# Patient Record
Sex: Male | Born: 1955 | Race: Black or African American | Hispanic: No | Smoking: Former smoker
Health system: Southern US, Community
[De-identification: ages and names within clinical notes are randomized; demographics above are authoritative.]

## PROBLEM LIST (undated history)

## (undated) DIAGNOSIS — I499 Cardiac arrhythmia, unspecified: Secondary | ICD-10-CM

## (undated) DIAGNOSIS — G4730 Sleep apnea, unspecified: Secondary | ICD-10-CM

## (undated) DIAGNOSIS — I10 Essential (primary) hypertension: Secondary | ICD-10-CM

## (undated) DIAGNOSIS — R0602 Shortness of breath: Secondary | ICD-10-CM

## (undated) DIAGNOSIS — E119 Type 2 diabetes mellitus without complications: Secondary | ICD-10-CM

## (undated) DIAGNOSIS — M1990 Unspecified osteoarthritis, unspecified site: Secondary | ICD-10-CM

## (undated) HISTORY — PX: OTHER SURGICAL HISTORY: SHX169

---

## 2003-06-29 ENCOUNTER — Inpatient Hospital Stay (HOSPITAL_COMMUNITY): Admission: EM | Admit: 2003-06-29 | Discharge: 2003-07-01 | Payer: Self-pay | Admitting: Emergency Medicine

## 2007-07-27 DIAGNOSIS — D126 Benign neoplasm of colon, unspecified: Secondary | ICD-10-CM | POA: Insufficient documentation

## 2012-11-05 ENCOUNTER — Emergency Department (HOSPITAL_COMMUNITY): Payer: Medicare FFS

## 2012-11-05 ENCOUNTER — Encounter (HOSPITAL_COMMUNITY): Payer: Self-pay | Admitting: Emergency Medicine

## 2012-11-05 ENCOUNTER — Emergency Department (HOSPITAL_COMMUNITY)
Admission: EM | Admit: 2012-11-05 | Discharge: 2012-11-05 | Disposition: A | Discharge status: Home Health | Payer: Medicare FFS | Attending: Emergency Medicine | Admitting: Emergency Medicine

## 2012-11-05 DIAGNOSIS — J40 Bronchitis, not specified as acute or chronic: Secondary | ICD-10-CM | POA: Insufficient documentation

## 2012-11-05 DIAGNOSIS — R0789 Other chest pain: Secondary | ICD-10-CM

## 2012-11-05 DIAGNOSIS — Z8739 Personal history of other diseases of the musculoskeletal system and connective tissue: Secondary | ICD-10-CM | POA: Insufficient documentation

## 2012-11-05 DIAGNOSIS — I499 Cardiac arrhythmia, unspecified: Secondary | ICD-10-CM | POA: Insufficient documentation

## 2012-11-05 DIAGNOSIS — Z87891 Personal history of nicotine dependence: Secondary | ICD-10-CM | POA: Insufficient documentation

## 2012-11-05 DIAGNOSIS — Z8679 Personal history of other diseases of the circulatory system: Secondary | ICD-10-CM | POA: Insufficient documentation

## 2012-11-05 HISTORY — DX: Unspecified osteoarthritis, unspecified site: M19.90

## 2012-11-05 HISTORY — DX: Cardiac arrhythmia, unspecified: I49.9

## 2012-11-05 LAB — BASIC METABOLIC PANEL
BUN: 10 mg/dL (ref 6–23)
CO2: 25 mEq/L (ref 19–32)
Calcium: 9.3 mg/dL (ref 8.4–10.5)
Chloride: 106 mEq/L (ref 96–112)
Creatinine, Ser: 0.91 mg/dL (ref 0.50–1.35)
GFR calc Af Amer: >90 mL/min (ref >90)
GFR calc non Af Amer: >90 mL/min (ref >90)
Glucose, Bld: 130 mg/dL — ABNORMAL HIGH (ref 70–99)
Potassium: 4.2 mEq/L (ref 3.5–5.1)
Sodium: 142 mEq/L (ref 135–145)

## 2012-11-05 LAB — CBC
HCT: 40.4 % (ref 39.0–52.0)
Hemoglobin: 14.4 g/dL (ref 13.0–17.0)
MCH: 29.4 pg (ref 26.0–34.0)
MCHC: 35.6 g/dL (ref 30.0–36.0)
MCV: 82.6 fL (ref 78.0–100.0)
Platelets: 205 10*3/uL (ref 150–400)
RBC: 4.89 MIL/uL (ref 4.22–5.81)
RDW: 13.9 % (ref 11.5–15.5)
WBC: 8.2 10*3/uL (ref 4.0–10.5)

## 2012-11-05 LAB — POCT I-STAT TROPONIN I: Troponin i, poc: 0 ng/mL (ref 0.00–0.08)

## 2012-11-05 LAB — PRO B NATRIURETIC PEPTIDE: Pro B Natriuretic peptide (BNP): 10.1 pg/mL (ref 0–125)

## 2012-11-05 MED ORDER — AZITHROMYCIN 250 MG PO TABS: ORAL_TABLET | ORAL | Status: DC

## 2012-11-05 MED ORDER — ALBUTEROL SULFATE HFA 108 (90 BASE) MCG/ACT IN AERS: 2.0000 | INHALATION_SPRAY | Freq: Four times a day (QID) | RESPIRATORY_TRACT | Status: AC | PRN

## 2012-11-05 NOTE — ED Notes (Signed)
Pt discharged to home with family. NAD.  

## 2012-11-05 NOTE — ED Provider Notes (Signed)
History     CSN: [id number]  Arrival date & time 11/05/12  1000   First MD Initiated Contact with Patient 11/05/12 1021      Chief Complaint  Patient presents with  . Chest Pain    (Consider location/radiation/quality/duration/timing/severity/associated sxs/prior treatment) HPI Comments: Patient comes to the ER for evaluation of chest pain. Patient reports that he has been experiencing intermittent left-sided chest pain for 2 days. He reports that symptoms began around the time he started to try to quit smoking. He has had a lot of increased coughing. He has noticed that the pain occurs after he coughs. It spontaneously resolves. No pain with exertion. He has felt short of breath with his coughing. No fever.  Patient does report that he has a previous history of ablation for atrial fibrillation. He thinks that he had his arteries checked at that time and they were okay.  Patient is a 57 y.o. male presenting with chest pain.  Chest Pain Associated symptoms: cough and shortness of breath     Past Medical History  Diagnosis Date  . Irregular heart beat   . Arthritis     History reviewed. No pertinent past surgical history.  History reviewed. No pertinent family history.  History  Substance Use Topics  . Smoking status: Former Games developer  . Smokeless tobacco: Not on file  . Alcohol Use: No      Review of Systems  Respiratory: Positive for cough and shortness of breath.   Cardiovascular: Positive for chest pain.  All other systems reviewed and are negative.    Allergies  Review of patient's allergies indicates no known allergies.  Home Medications  No current outpatient prescriptions on file.  BP 149/94  Pulse 70  Temp(Src) 98.2 F (36.8 C) (Oral)  Resp 18  SpO2 96%  Physical Exam  Constitutional: He is oriented to person, place, and time. He appears well-developed and well-nourished. No distress.  HENT:  Head: Normocephalic and atraumatic.  Right Ear:  Hearing normal.  Nose: Nose normal.  Mouth/Throat: Oropharynx is clear and moist and mucous membranes are normal.  Eyes: Conjunctivae and EOM are normal. Pupils are equal, round, and reactive to light.  Neck: Normal range of motion. Neck supple.  Cardiovascular: Normal rate, regular rhythm, S1 normal and S2 normal.  Exam reveals no gallop and no friction rub.   No murmur heard. Pulmonary/Chest: Effort normal and breath sounds normal. No respiratory distress. He exhibits no tenderness.  Abdominal: Soft. Normal appearance and bowel sounds are normal. There is no hepatosplenomegaly. There is no tenderness. There is no rebound, no guarding, no tenderness at McBurney's point and negative Murphy's sign. No hernia.  Musculoskeletal: Normal range of motion.  Neurological: He is alert and oriented to person, place, and time. He has normal strength. No cranial nerve deficit or sensory deficit. Coordination normal. GCS eye subscore is 4. GCS verbal subscore is 5. GCS motor subscore is 6.  Skin: Skin is warm, dry and intact. No rash noted. No cyanosis.  Psychiatric: He has a normal mood and affect. His speech is normal and behavior is normal. Thought content normal.    ED Course  Procedures (including critical care time)  EKG  Date: 11/05/2012  Rhythm: normal sinus rhythm  QRS Axis: normal  Intervals: normal  ST/T Wave abnormalities: nonspecific ST/T changes  Conduction Disutrbances:none  Narrative Interpretation:   Old EKG Reviewed: none available     Labs Reviewed  BASIC METABOLIC PANEL - Abnormal; Notable for the following:  Glucose, Bld 130 (*)    All other components within normal limits  CBC  PRO B NATRIURETIC PEPTIDE  POCT I-STAT TROPONIN I   Dg Chest 2 View  11/05/2012  *RADIOLOGY REPORT*  Clinical Data: Left chest pain, shortness of breath  CHEST - 2 VIEW  Comparison: 12/27/2006  Findings: Lungs are essentially clear.  No focal consolidation. No pleural effusion or  pneumothorax.  The heart is top normal in size.  Mild degenerative changes of the visualized thoracolumbar spine.  Old right posterior 9th rib fracture.  IMPRESSION: No evidence of acute cardiopulmonary disease.   Original Report Authenticated By: Charline Bills, M.D.      Diagnoses: 1. Bronchitis 2. Atypical chest pain    MDM  Patient comes to the ER for evaluation of chest pain. Symptoms are very atypical for cardiac chest pain. Has been experiencing increased cough secondary to trying to stop smoking. He reports that after he has a long coughing spell he gets some pain in the left side of his chest which lasts only briefly. He says it stays there only a couple of minutes and then goes away. He has had shortness of breath associated with the cough and chest congestion symptoms. Patient reports a previous ablation for atrial fibrillation at the [facility]. Patient thinks that he had a heart catheterization at the time of the ablation that did not show any coronary artery disease. Based on the very atypical nature of his symptoms, I believe the patient can be discharged. His cardiac workup was negative here. He is to followup with his doctor at the [place]. Patient was counseled to return to the ER for any pain does not last only briefly like his current symptoms have been. Will be prescribed albuterol and Zithromax for the cough and chest congestion.        Gilda Crease, MD 11/05/12 1250

## 2012-11-05 NOTE — ED Notes (Signed)
Pt c/o left sided CP x 2 days with SOB with exertion

## 2015-01-17 ENCOUNTER — Emergency Department (HOSPITAL_COMMUNITY)
Admission: EM | Admit: 2015-01-17 | Discharge: 2015-01-17 | Disposition: A | Discharge status: Home / Self Care | Payer: Medicare PPO | Attending: Emergency Medicine | Admitting: Emergency Medicine

## 2015-01-17 ENCOUNTER — Encounter (HOSPITAL_COMMUNITY): Payer: Self-pay | Admitting: *Deleted

## 2015-01-17 DIAGNOSIS — M1990 Unspecified osteoarthritis, unspecified site: Secondary | ICD-10-CM | POA: Insufficient documentation

## 2015-01-17 DIAGNOSIS — Z8679 Personal history of other diseases of the circulatory system: Secondary | ICD-10-CM | POA: Insufficient documentation

## 2015-01-17 DIAGNOSIS — Z79899 Other long term (current) drug therapy: Secondary | ICD-10-CM | POA: Diagnosis not present

## 2015-01-17 DIAGNOSIS — R030 Elevated blood-pressure reading, without diagnosis of hypertension: Secondary | ICD-10-CM | POA: Diagnosis present

## 2015-01-17 DIAGNOSIS — IMO0001 Reserved for inherently not codable concepts without codable children: Secondary | ICD-10-CM

## 2015-01-17 DIAGNOSIS — Z720 Tobacco use: Secondary | ICD-10-CM | POA: Insufficient documentation

## 2015-01-17 DIAGNOSIS — Z7982 Long term (current) use of aspirin: Secondary | ICD-10-CM | POA: Diagnosis not present

## 2015-01-17 NOTE — ED Provider Notes (Signed)
CSN: [id number]     Arrival date & time 01/17/15  1731 History  This chart was scribed for Steven Bible, PA-C, working with Ernestina Patches, MD by Steva Colder, ED Scribe. The patient was seen in room TR03C/TR03C at 8:37 PM.    Chief Complaint  Patient presents with  . Hypertension      The history is provided by the patient. No language interpreter was used.    HPI Comments: [name] is a 59 y.o. male who presents to the Emergency Department complaining of HTN onset last night. Pt notes that he was feeling dizzy last evening, which has resolved.   He also states that  his BP was 178/88 this evening at the EMS bay and was informed to get it checked. Pt reports that he has been under a lot of stress lately and that the dizziness occurred yesterday when he bends over or moved. Pt recently went to the [place] 1 month ago and had labs checked and he reports everything returned normal. He denies dizziness, CP, HA, vision changes, weakness, urinary issues, and any other symptoms. Pt denies hx of HTN.  Past Medical History  Diagnosis Date  . Irregular heart beat   . Arthritis    History reviewed. No pertinent past surgical history. No family history on file. History  Substance Use Topics  . Smoking status: Current Every Day Smoker -- 0.50 packs/day    Types: Cigarettes  . Smokeless tobacco: Not on file  . Alcohol Use: No    Review of Systems  Eyes: Negative for visual disturbance.  Cardiovascular: Negative for chest pain.  Genitourinary: Negative for difficulty urinating.  Neurological: Negative for dizziness, weakness and headaches.      Allergies  Review of patient's allergies indicates no known allergies.  Home Medications   Prior to Admission medications   Medication Sig Start Date End Date Taking? Authorizing Provider  albuterol (PROVENTIL HFA;VENTOLIN HFA) 108 (90 BASE) MCG/ACT inhaler Inhale 2 puffs into the lungs every 6 (six) hours as needed for wheezing. 11/05/12   Yes Orpah Greek, MD  aspirin 325 MG EC tablet Take 325 mg by mouth daily.   Yes Historical Provider, MD  traMADol (ULTRAM) 50 MG tablet Take 50 mg by mouth every 6 (six) hours as needed for moderate pain.   Yes Historical Provider, MD   BP 164/96 mmHg  Pulse 72  Temp(Src) 98.3 F (36.8 C) (Oral)  Resp 19  SpO2 99% Physical Exam  Constitutional: He is oriented to person, place, and time. He appears well-developed and well-nourished. No distress.  HENT:  Head: Normocephalic and atraumatic.  Eyes: EOM are normal. Pupils are equal, round, and reactive to light.  Neck: Neck supple. No tracheal deviation present.  Cardiovascular: Normal rate, regular rhythm and normal heart sounds.  Exam reveals no gallop and no friction rub.   No murmur heard. Pulmonary/Chest: Effort normal and breath sounds normal. No respiratory distress. He has no wheezes. He has no rales.  Musculoskeletal: Normal range of motion.  Neurological: He is alert and oriented to person, place, and time. No cranial nerve deficit. Gait normal.  Cranial nerves intact. Nl gait, no ataxia. Nl rapid alternating movements. Nl finger to nose movements.  Skin: Skin is warm and dry.  Psychiatric: He has a normal mood and affect. His behavior is normal.  Nursing note and vitals reviewed.   ED Course  Procedures (including critical care time) DIAGNOSTIC STUDIES: Oxygen Saturation is 96% on RA, nl by my  interpretation.    COORDINATION OF CARE: 8:43 PM-Discussed treatment plan with pt at bedside and pt agreed to plan.   Labs Review Labs Reviewed - No data to display  Imaging Review No results found.   EKG Interpretation None     Today's Vitals   01/17/15 1750 01/17/15 1751 01/17/15 2035  BP: 138/85  164/96  Pulse: 76  72  Temp: 98.3 F (36.8 C)    TempSrc: Oral    Resp: 18  19  SpO2: 96%  99%  PainSc:  0-No pain    MDM   Final diagnoses:  None   Patient presents today with a chief complaint of HTN.   He is asymptomatic at this time.  No signs of Hypertensive Emergency or Urgency.  Normal neurological exam.  BP 138/85 in the ED without medications.  Feel that the patient is stable for discharge.  Patient instructed to follow up with PCP regarding elevated blood pressure.     Steven Bible, PA-C 01/19/15 2226  Ernestina Patches, MD 01/21/15 [phone]

## 2015-01-17 NOTE — ED Notes (Signed)
Pt c/o dizziness when bending over last night, went to ambulance bay and his BP was 178/88 and was instructed to come to ED. Pt states that he has been under a lot of stress lately. States that last night is when he noticed the dizziness when he bends over. Denies any dizziness at this time. States that he had a full work up last month at the [place] in [place] and everything checked out fine.

## 2015-01-17 NOTE — Discharge Instructions (Signed)
It is recommended that you keep a log of your blood pressure.  DASH Eating Plan DASH stands for "Dietary Approaches to Stop Hypertension." The DASH eating plan is a healthy eating plan that has been shown to reduce high blood pressure (hypertension). Additional health benefits may include reducing the risk of type 2 diabetes mellitus, heart disease, and stroke. The DASH eating plan may also help with weight loss. WHAT DO I NEED TO KNOW ABOUT THE DASH EATING PLAN? For the DASH eating plan, you will follow these general guidelines:  Choose foods with a percent daily value for sodium of less than 5% (as listed on the food label).  Use salt-free seasonings or herbs instead of table salt or sea salt.  Check with your health care provider or pharmacist before using salt substitutes.  Eat lower-sodium products, often labeled as "lower sodium" or "no salt added."  Eat fresh foods.  Eat more vegetables, fruits, and low-fat dairy products.  Choose whole grains. Look for the word "whole" as the first word in the ingredient list.  Choose fish and skinless chicken or [place] more often than red meat. Limit fish, poultry, and meat to 6 oz (170 g) each day.  Limit sweets, desserts, sugars, and sugary drinks.  Choose heart-healthy fats.  Limit cheese to 1 oz (28 g) per day.  Eat more home-cooked food and less restaurant, buffet, and fast food.  Limit fried foods.  Cook foods using methods other than frying.  Limit canned vegetables. If you do use them, rinse them well to decrease the sodium.  When eating at a restaurant, ask that your food be prepared with less salt, or no salt if possible. WHAT FOODS CAN I EAT? Seek help from a dietitian for individual calorie needs. Grains Whole grain or whole wheat bread. Brown rice. Whole grain or whole wheat pasta. Quinoa, bulgur, and whole grain cereals. Low-sodium cereals. Corn or whole wheat flour tortillas. Whole grain cornbread. Whole grain  crackers. Low-sodium crackers. Vegetables Fresh or frozen vegetables (raw, steamed, roasted, or grilled). Low-sodium or reduced-sodium tomato and vegetable juices. Low-sodium or reduced-sodium tomato sauce and paste. Low-sodium or reduced-sodium canned vegetables.  Fruits All fresh, canned (in natural juice), or frozen fruits. Meat and Other Protein Products Ground beef (85% or leaner), grass-fed beef, or beef trimmed of fat. Skinless chicken or [place]. Ground chicken or [place]. Pork trimmed of fat. All fish and seafood. Eggs. Dried beans, peas, or lentils. Unsalted nuts and seeds. Unsalted canned beans. Dairy Low-fat dairy products, such as skim or 1% milk, 2% or reduced-fat cheeses, low-fat ricotta or cottage cheese, or plain low-fat yogurt. Low-sodium or reduced-sodium cheeses. Fats and Oils Tub margarines without trans fats. Light or reduced-fat mayonnaise and salad dressings (reduced sodium). Avocado. Safflower, olive, or canola oils. Natural peanut or almond butter. Other Unsalted popcorn and pretzels. The items listed above may not be a complete list of recommended foods or beverages. Contact your dietitian for more options. WHAT FOODS ARE NOT RECOMMENDED? Grains White bread. White pasta. White rice. Refined cornbread. Bagels and croissants. Crackers that contain trans fat. Vegetables Creamed or fried vegetables. Vegetables in a cheese sauce. Regular canned vegetables. Regular canned tomato sauce and paste. Regular tomato and vegetable juices. Fruits Dried fruits. Canned fruit in light or heavy syrup. Fruit juice. Meat and Other Protein Products Fatty cuts of meat. Ribs, chicken wings, bacon, sausage, bologna, salami, chitterlings, fatback, hot dogs, bratwurst, and packaged luncheon meats. Salted nuts and seeds. Canned beans with salt. Dairy Whole  or 2% milk, cream, half-and-half, and cream cheese. Whole-fat or sweetened yogurt. Full-fat cheeses or blue cheese. Nondairy creamers and  whipped toppings. Processed cheese, cheese spreads, or cheese curds. Condiments Onion and garlic salt, seasoned salt, table salt, and sea salt. Canned and packaged gravies. Worcestershire sauce. Tartar sauce. Barbecue sauce. Teriyaki sauce. Soy sauce, including reduced sodium. Steak sauce. Fish sauce. Oyster sauce. Cocktail sauce. Horseradish. Ketchup and mustard. Meat flavorings and tenderizers. Bouillon cubes. Hot sauce. Tabasco sauce. Marinades. Taco seasonings. Relishes. Fats and Oils Butter, stick margarine, lard, shortening, ghee, and bacon fat. Coconut, palm kernel, or palm oils. Regular salad dressings. Other Pickles and olives. Salted popcorn and pretzels. The items listed above may not be a complete list of foods and beverages to avoid. Contact your dietitian for more information. WHERE CAN I FIND MORE INFORMATION? National Heart, Lung, and Blood Institute: travelstabloid.com Document Released: 07/01/2011 Document Revised: 11/26/2013 Document Reviewed: 05/16/2013 [facility] Patient Information 2015 [place], [place]. This information is not intended to replace advice given to you by your health care provider. Make sure you discuss any questions you have with your health care provider.  Hypertension Hypertension is another name for high blood pressure. High blood pressure forces your heart to work harder to pump blood. A blood pressure reading has two numbers, which includes a higher number over a lower number (example: 110/72). HOME CARE   Have your blood pressure rechecked by your doctor.  Only take medicine as told by your doctor. Follow the directions carefully. The medicine does not work as well if you skip doses. Skipping doses also puts you at risk for problems.  Do not smoke.  Monitor your blood pressure at home as told by your doctor. GET HELP IF:  You think you are having a reaction to the medicine you are taking.  You have repeat headaches  or feel dizzy.  You have puffiness (swelling) in your ankles.  You have trouble with your vision. GET HELP RIGHT AWAY IF:   You get a very bad headache and are confused.  You feel weak, numb, or faint.  You get chest or belly (abdominal) pain.  You throw up (vomit).  You cannot breathe very well. MAKE SURE YOU:   Understand these instructions.  Will watch your condition.  Will get help right away if you are not doing well or get worse. Document Released: 12/29/2007 Document Revised: 07/17/2013 Document Reviewed: 05/04/2013 [facility] Patient Information 2015 [place], [place]. This information is not intended to replace advice given to you by your health care provider. Make sure you discuss any questions you have with your health care provider.

## 2017-02-25 ENCOUNTER — Emergency Department (HOSPITAL_COMMUNITY)
Admission: EM | Admit: 2017-02-25 | Discharge: 2017-02-25 | Disposition: A | Discharge status: Home / Self Care | Payer: Non-veteran care | Attending: Emergency Medicine | Admitting: Emergency Medicine

## 2017-02-25 ENCOUNTER — Encounter (HOSPITAL_COMMUNITY): Payer: Self-pay | Admitting: Emergency Medicine

## 2017-02-25 ENCOUNTER — Emergency Department (HOSPITAL_COMMUNITY): Payer: Non-veteran care

## 2017-02-25 DIAGNOSIS — I10 Essential (primary) hypertension: Secondary | ICD-10-CM | POA: Insufficient documentation

## 2017-02-25 DIAGNOSIS — Z79899 Other long term (current) drug therapy: Secondary | ICD-10-CM | POA: Insufficient documentation

## 2017-02-25 DIAGNOSIS — F17210 Nicotine dependence, cigarettes, uncomplicated: Secondary | ICD-10-CM | POA: Diagnosis not present

## 2017-02-25 DIAGNOSIS — Z7982 Long term (current) use of aspirin: Secondary | ICD-10-CM | POA: Insufficient documentation

## 2017-02-25 DIAGNOSIS — R42 Dizziness and giddiness: Secondary | ICD-10-CM | POA: Insufficient documentation

## 2017-02-25 LAB — I-STAT TROPONIN, ED: Troponin i, poc: 0 ng/mL (ref 0.00–0.08)

## 2017-02-25 LAB — CBC WITH DIFFERENTIAL/PLATELET
Basophils Absolute: 0 10*3/uL (ref 0.0–0.1)
Basophils Relative: 0 %
Eosinophils Absolute: 0.1 10*3/uL (ref 0.0–0.7)
Eosinophils Relative: 0 %
HCT: 43.3 % (ref 39.0–52.0)
Hemoglobin: 14.6 g/dL (ref 13.0–17.0)
Lymphocytes Relative: 14 %
Lymphs Abs: 1.7 10*3/uL (ref 0.7–4.0)
MCH: 27.9 pg (ref 26.0–34.0)
MCHC: 33.7 g/dL (ref 30.0–36.0)
MCV: 82.8 fL (ref 78.0–100.0)
Monocytes Absolute: 0.4 10*3/uL (ref 0.1–1.0)
Monocytes Relative: 3 %
Neutro Abs: 9.8 10*3/uL — ABNORMAL HIGH (ref 1.7–7.7)
Neutrophils Relative %: 83 %
Platelets: 217 10*3/uL (ref 150–400)
RBC: 5.23 MIL/uL (ref 4.22–5.81)
RDW: 14.4 % (ref 11.5–15.5)
WBC: 11.9 10*3/uL — ABNORMAL HIGH (ref 4.0–10.5)

## 2017-02-25 LAB — BASIC METABOLIC PANEL
Anion gap: 8 (ref 5–15)
BUN: 11 mg/dL (ref 6–20)
CO2: 29 mmol/L (ref 22–32)
Calcium: 9.4 mg/dL (ref 8.9–10.3)
Chloride: 104 mmol/L (ref 101–111)
Creatinine, Ser: 1.04 mg/dL (ref 0.61–1.24)
GFR calc Af Amer: >60 mL/min (ref >60)
GFR calc non Af Amer: >60 mL/min (ref >60)
Glucose, Bld: 158 mg/dL — ABNORMAL HIGH (ref 65–99)
Potassium: 4.1 mmol/L (ref 3.5–5.1)
Sodium: 141 mmol/L (ref 135–145)

## 2017-02-25 MED ORDER — ONDANSETRON HCL 4 MG PO TABS
4.0000 mg | ORAL_TABLET | Freq: Once | ORAL | Status: AC
  Administered 2017-02-25: 4 mg via ORAL
  Filled 2017-02-25: qty 1

## 2017-02-25 MED ORDER — MECLIZINE HCL 25 MG PO TABS: 25.0000 mg | ORAL_TABLET | Freq: Three times a day (TID) | ORAL | 0 refills | Status: DC | PRN

## 2017-02-25 MED ORDER — MECLIZINE HCL 25 MG PO TABS
25.0000 mg | ORAL_TABLET | Freq: Once | ORAL | Status: AC
  Administered 2017-02-25: 25 mg via ORAL
  Filled 2017-02-25: qty 1

## 2017-02-25 MED ORDER — PROMETHAZINE HCL 12.5 MG PO TABS: 25.0000 mg | ORAL_TABLET | Freq: Four times a day (QID) | ORAL | 0 refills | Status: DC | PRN

## 2017-02-25 NOTE — ED Triage Notes (Signed)
Report from [facility]> reports sudden onset of dizziness when he sat up in bed at 6pm.  Reports nausea at the time that has resolved.  PT denies dizziness when he lays back down.   History of HTN.  Dr. Oleta Mouse and resident notified of pt with sudden onset of dizziness.

## 2017-02-25 NOTE — Discharge Instructions (Signed)
Your work up was reassuring today. Please return to ED with any worsening of symptoms, weakness, numbness or tingling, or any new concerning neurological symptoms. Please do not drink or drive while taking meclizine or Phenergan.

## 2017-02-25 NOTE — ED Notes (Signed)
Lab at bedside

## 2017-02-25 NOTE — ED Notes (Signed)
Returned from CT.

## 2017-02-25 NOTE — ED Provider Notes (Signed)
[facility] DEPT Provider Note   CSN: [id number] Arrival date & time: 02/25/17  1937     History   Chief Complaint Chief Complaint  Patient presents with  . Dizziness      HPI Steven Powers is a 61 y.o. Male. With history of hypertension, arthritis who presents with complaints of dizziness. Onset was 1.5 hours prior to arrival when patient went from sitting to standing position. Symptoms are described as room spinning, and associated with nausea. Denies any vomiting, fevers or chills, weakness, numbness or tingling. He denies any changes in vision. Symptoms improved with lying still, and are exacerbated by any movement. She has never had symptoms of this before. He denies any previous URI symptoms.  Past Medical History:  Diagnosis Date  . Arthritis   . Irregular heart beat     Patient Active Problem List   Diagnosis Date Noted  . Irregular heart beat     History reviewed. No pertinent surgical history.     Home Medications    Prior to Admission medications   Medication Sig Start Date End Date Taking? Authorizing Provider  aspirin 81 MG tablet Take 81 mg by mouth daily.    Yes [provider]  atenolol (TENORMIN) 25 MG tablet Take 25 mg by mouth daily.   Yes [provider]  albuterol (PROVENTIL HFA;VENTOLIN HFA) 108 (90 BASE) MCG/ACT inhaler Inhale 2 puffs into the lungs every 6 (six) hours as needed for wheezing. Patient not taking: Reported on 02/25/2017 11/05/12   Orpah Greek, MD  meclizine (ANTIVERT) 25 MG tablet Take 1 tablet (25 mg total) by mouth 3 (three) times daily as needed for dizziness. 02/25/17   Arnetha Massy, MD  promethazine (PHENERGAN) 12.5 MG tablet Take 2 tablets (25 mg total) by mouth every 6 (six) hours as needed for nausea or vomiting. 02/25/17   Arnetha Massy, MD    Family History No family history on file.  Social History Social History  Substance Use Topics  . Smoking status: Current Every Day Smoker   Packs/day: 0.50    Types: Cigarettes  . Smokeless tobacco: Never Used  . Alcohol use No     Allergies   Patient has no known allergies.   Review of Systems Review of Systems  Constitutional: Negative for chills and fever.  HENT: Negative for ear pain and sore throat.   Eyes: Negative for pain and visual disturbance.  Respiratory: Negative for cough and shortness of breath.   Cardiovascular: Negative for chest pain and palpitations.  Gastrointestinal: Positive for nausea. Negative for abdominal pain and vomiting.  Genitourinary: Negative for dysuria and hematuria.  Musculoskeletal: Negative for arthralgias and back pain.  Skin: Negative for color change and rash.  Neurological: Positive for dizziness. Negative for seizures and syncope.  All other systems reviewed and are negative.    Physical Exam Updated Vital Signs BP (!) 138/99   Pulse 60   Temp 97.6 F (36.4 C)   Resp 14   SpO2 95%   Physical Exam  Constitutional: He is oriented to person, place, and time. He appears well-developed and well-nourished.  HENT:  Head: Normocephalic and atraumatic.  Eyes: Conjunctivae are normal.  Neck: Neck supple.  Cardiovascular: Normal rate and regular rhythm.   No murmur heard. Pulmonary/Chest: Effort normal and breath sounds normal. No respiratory distress.  Abdominal: Soft. There is no tenderness.  Musculoskeletal: He exhibits no edema.  Neurological: He is alert and oriented to person, place, and time. No cranial nerve  deficit or sensory deficit. He exhibits normal muscle tone. Coordination normal.  No nystagmus. Symptomatic with dix-hallpike.  Skin: Skin is warm and dry.  Psychiatric: He has a normal mood and affect.  Nursing note and vitals reviewed.    ED Treatments / Results  Labs (all labs ordered are listed, but only abnormal results are displayed) Labs Reviewed  CBC WITH DIFFERENTIAL/PLATELET - Abnormal; Notable for the following:       Result Value   WBC  11.9 (*)    Neutro Abs 9.8 (*)    All other components within normal limits  BASIC METABOLIC PANEL - Abnormal; Notable for the following:    Glucose, Bld 158 (*)    All other components within normal limits  I-STAT TROPONIN, ED    EKG  EKG Interpretation  Date/Time:  Friday February 25 2017 20:04:32 EDT Ventricular Rate:  57 PR Interval:    QRS Duration: 83 QT Interval:  401 QTC Calculation: 391 R Axis:   -31 Text Interpretation:  Sinus rhythm Left atrial enlargement Left axis deviation Nonspecific T abnormalities, lateral leads Baseline wander in lead(s) I II aVR aVL V3 Confirmed by Brantley Stage [phone]) on 02/25/2017 9:41:06 PM       Radiology Dg Chest 2 View  Result Date: 02/25/2017 CLINICAL DATA:  Dizziness and high BP x 2 days, smoker, hx irreg heart rate EXAM: CHEST  2 VIEW COMPARISON:  Chest x-ray dated 11/05/2012. FINDINGS: Cardiomegaly is stable. Overall cardiomediastinal silhouette is stable. Atherosclerotic changes noted at the aortic arch. Lungs are clear. No pleural effusion or pneumothorax seen. Osseous structures about the chest are unremarkable. IMPRESSION: No active cardiopulmonary disease. No evidence of pneumonia or pulmonary edema. Stable cardiomegaly. Aortic atherosclerosis. Electronically Signed   By: Franki Cabot M.D.   On: 02/25/2017 20:50    Procedures Procedures (including critical care time)  Medications Ordered in ED Medications  meclizine (ANTIVERT) tablet 25 mg (25 mg Oral Given 02/25/17 2138)  ondansetron (ZOFRAN) tablet 4 mg (4 mg Oral Given 02/25/17 2206)     Initial Impression / Assessment and Plan / ED Course  I have reviewed the triage vital signs and the nursing notes.  Pertinent labs & imaging results that were available during my care of the patient were reviewed by me and considered in my medical decision making (see chart for details).    Patient arrived hemodynamically stable, in mild distress. Exam as above, significant for positional  symptoms elicited during dix-hallpike. Considered CVA - but nonfocal neuro exam without any cerebellar/ataxia deficits. Improvement in symptoms with antiemetics and meclizine. Exam consistent with BPPV. No indication for further imaging at this time. Labs reassuring. EKG showing T wave depression in lateral leads - but patient denies any chest pain, shortness of breath, palpitations. Last EKG to compare 2014, I suspect these changes are not acute. Discussed symptoms with patient, close follow-up with PCM. Return precautions discussed. Patient in agreement with plan at time of discharge.  Patient and plan of care discussed with Attending physician, Dr. Oleta Mouse.    Final Clinical Impressions(s) / ED Diagnoses   Final diagnoses:  Vertigo    New Prescriptions Discharge Medication List as of 02/25/2017 10:52 PM    START taking these medications   Details  meclizine (ANTIVERT) 25 MG tablet Take 1 tablet (25 mg total) by mouth 3 (three) times daily as needed for dizziness., Starting Fri 02/25/2017, Print    promethazine (PHENERGAN) 12.5 MG tablet Take 2 tablets (25 mg total) by mouth every 6 (  six) hours as needed for nausea or vomiting., Starting Fri 02/25/2017, Print         Arnetha Massy, MD 02/26/17 [id number]    Forde Dandy, MD 02/26/17 [phone]

## 2017-02-26 NOTE — ED Provider Notes (Signed)
I saw and evaluated the patient, reviewed the resident's note and I agree with the findings and plan.   EKG Interpretation  Date/Time:  Friday February 25 2017 20:04:32 EDT Ventricular Rate:  57 PR Interval:    QRS Duration: 83 QT Interval:  401 QTC Calculation: 391 R Axis:   -31 Text Interpretation:  Sinus rhythm Left atrial enlargement Left axis deviation Nonspecific T abnormalities, lateral leads Baseline wander in lead(s) I II aVR aVL V3 Confirmed by Brantley Stage [phone]) on 02/25/2017 9:41:72 PM      61 year old male with history of HTN who presents with sudden onset dizziness 1 hour PTA. States that he sat up from bed and had sudden onset of dizziness that felt like the room may be spinning. It is associated with nausea but denies any vomiting. Symptoms improved with lying down and closing his eyes. Denies any syncope, chest pain or difficulty breathing. No vision or speech changes, difficulty swallowing, focal numbness or weakness. States that he felt like he was drunk.  On presentation, minimally hypertensive. He has a normal neurological exam. No features on neurological exam that would suggest central vertigo. No abnormal test of skew, dysmetria, neuro deficits. He did receive meclizine, with improvement in his symptoms. I was able to ambulate him and he states that he feels significantly better. We'll discharge with meclizine. Strict return and follow-up instructions reviewed. He expressed understanding of all discharge instructions and felt comfortable with the plan of care.     Forde Dandy, MD 02/26/17 [phone]

## 2017-07-26 DIAGNOSIS — E119 Type 2 diabetes mellitus without complications: Secondary | ICD-10-CM | POA: Insufficient documentation

## 2017-12-20 DIAGNOSIS — M5416 Radiculopathy, lumbar region: Secondary | ICD-10-CM | POA: Insufficient documentation

## 2017-12-20 DIAGNOSIS — I499 Cardiac arrhythmia, unspecified: Secondary | ICD-10-CM | POA: Insufficient documentation

## 2017-12-20 DIAGNOSIS — M47816 Spondylosis without myelopathy or radiculopathy, lumbar region: Secondary | ICD-10-CM | POA: Insufficient documentation

## 2017-12-20 DIAGNOSIS — M51369 Other intervertebral disc degeneration, lumbar region without mention of lumbar back pain or lower extremity pain: Secondary | ICD-10-CM | POA: Insufficient documentation

## 2017-12-20 DIAGNOSIS — M5136 Other intervertebral disc degeneration, lumbar region: Secondary | ICD-10-CM | POA: Insufficient documentation

## 2018-07-06 DIAGNOSIS — R3915 Urgency of urination: Secondary | ICD-10-CM | POA: Insufficient documentation

## 2018-07-06 DIAGNOSIS — C61 Malignant neoplasm of prostate: Secondary | ICD-10-CM | POA: Insufficient documentation

## 2019-02-27 ENCOUNTER — Encounter (HOSPITAL_COMMUNITY): Payer: Self-pay | Admitting: *Deleted

## 2019-02-27 NOTE — Progress Notes (Signed)
Received [facility] authorization [id number] for this pt to participate in Pulmonary Rehab by Dr. Leanne Lovely at the [facility].  Pt diagnosis is Dypnea.  Reviewed provided medical records.  Pt is appropriate for scheduling Pulmonary Rehab.  Will forward to support staff for scheduling.

## 2019-03-02 ENCOUNTER — Telehealth (HOSPITAL_COMMUNITY): Payer: Self-pay

## 2019-05-10 ENCOUNTER — Emergency Department (HOSPITAL_COMMUNITY): Payer: No Typology Code available for payment source

## 2019-05-10 ENCOUNTER — Other Ambulatory Visit: Payer: Self-pay

## 2019-05-10 ENCOUNTER — Emergency Department (HOSPITAL_COMMUNITY)
Admission: EM | Admit: 2019-05-10 | Discharge: 2019-05-11 | Disposition: A | Discharge status: Home / Self Care | Payer: No Typology Code available for payment source | Attending: Emergency Medicine | Admitting: Emergency Medicine

## 2019-05-10 DIAGNOSIS — R079 Chest pain, unspecified: Secondary | ICD-10-CM | POA: Diagnosis present

## 2019-05-10 DIAGNOSIS — Z7982 Long term (current) use of aspirin: Secondary | ICD-10-CM | POA: Diagnosis not present

## 2019-05-10 DIAGNOSIS — F17210 Nicotine dependence, cigarettes, uncomplicated: Secondary | ICD-10-CM | POA: Insufficient documentation

## 2019-05-10 DIAGNOSIS — Z79899 Other long term (current) drug therapy: Secondary | ICD-10-CM | POA: Insufficient documentation

## 2019-05-10 MED ORDER — FENTANYL CITRATE (PF) 100 MCG/2ML IJ SOLN
50.0000 ug | Freq: Once | INTRAMUSCULAR | Status: AC
  Administered 2019-05-11: 50 ug via INTRAVENOUS
  Filled 2019-05-10: qty 2

## 2019-05-10 MED ORDER — SODIUM CHLORIDE 0.9 % IV SOLN
1000.0000 mL | INTRAVENOUS | Status: DC
  Administered 2019-05-11: 02:00:00 1000 mL via INTRAVENOUS

## 2019-05-10 MED ORDER — SODIUM CHLORIDE 0.9 % IV BOLUS (SEPSIS)
1000.0000 mL | Freq: Once | INTRAVENOUS | Status: AC
  Administered 2019-05-11: 1000 mL via INTRAVENOUS

## 2019-05-10 NOTE — ED Triage Notes (Addendum)
Pt from home BIB EMS with c/o CP at a 6/10 on the left side non-radiating since about 8:30 tonight. Pt given 324 ASA and 1 nitro with no relief, only caused a headache. Pt denies SHOB, lightheadedness, N/V. Pt had echocardiogram last Thursday with results pending. Pt has hx HTN. Per EMS, pt O2 sat was high 80s, placed him on 3L [place]. Per EMS pt has diminished lower lobe bilaterally. Pt O2 sat 97 currently on room air.

## 2019-05-10 NOTE — ED Provider Notes (Signed)
[facility] EMERGENCY DEPARTMENT Provider Note  CSN: [id number] Arrival date & time: 05/10/19 2254  Chief Complaint(s) Chest Pain  HPI Steven Powers is a 63 y.o. male    Chest Pain Pain location:  L chest Pain quality: sharp   Pain radiates to:  Does not radiate Pain severity now: mild to moderate. Onset quality:  Sudden Duration: usually last 5-15 mins; today lasted 15 min. Timing:  Intermittent Progression:  Resolved Chronicity:  Recurrent (for 2-3 months) Relieved by:  Nothing Worsened by:  Nothing Associated symptoms: dizziness   Associated symptoms: no abdominal pain, no anorexia, no anxiety, no back pain, no claudication, no cough, no diaphoresis, no fever, no heartburn, no nausea, no shortness of breath and no syncope   Risk factors: hypertension and male sex   Risk factors: no coronary artery disease (Cath 4-110yrs for ablation - was not told he had CAD), no diabetes mellitus, no high cholesterol, no prior DVT/PE and no smoking     Past Medical History Past Medical History:  Diagnosis Date   Arthritis    Irregular heart beat    Patient Active Problem List   Diagnosis Date Noted   Irregular heart beat    Home Medication(s) Prior to Admission medications   Medication Sig Start Date End Date Taking? Authorizing Provider  aspirin 81 MG tablet Take 81 mg by mouth daily.    Yes [provider]  atenolol (TENORMIN) 25 MG tablet Take 25 mg by mouth daily.   Yes [provider]  ibuprofen (ADVIL) 800 MG tablet Take 800 mg by mouth every 8 (eight) hours as needed for mild pain.    Yes [provider]  albuterol (PROVENTIL HFA;VENTOLIN HFA) 108 (90 BASE) MCG/ACT inhaler Inhale 2 puffs into the lungs every 6 (six) hours as needed for wheezing. Patient not taking: Reported on 02/25/2017 11/05/12   Orpah Greek, MD  meclizine (ANTIVERT) 25 MG tablet Take 1 tablet (25 mg total) by mouth 3 (three) times daily as needed  for dizziness. Patient not taking: Reported on 05/11/2019 02/25/17   Arnetha Massy, MD  promethazine (PHENERGAN) 12.5 MG tablet Take 2 tablets (25 mg total) by mouth every 6 (six) hours as needed for nausea or vomiting. Patient not taking: Reported on 05/11/2019 02/25/17   Arnetha Massy, MD                                                                                                                                    Past Surgical History No past surgical history on file. Family History No family history on file.  Social History Social History   Tobacco Use   Smoking status: Current Every Day Smoker    Packs/day: 0.50    Types: Cigarettes   Smokeless tobacco: Never Used  Substance Use Topics   Alcohol use: No   Drug use: No   Allergies Patient has no known allergies.  Review of Systems Review of Systems  Constitutional: Negative for diaphoresis and fever.  Respiratory: Negative for cough and shortness of breath.   Cardiovascular: Positive for chest pain. Negative for claudication and syncope.  Gastrointestinal: Negative for abdominal pain, anorexia, heartburn and nausea.  Musculoskeletal: Negative for back pain.  Neurological: Positive for dizziness.   All other systems are reviewed and are negative for acute change except as noted in the HPI  Physical Exam Vital Signs  I have reviewed the triage vital signs BP (!) 169/89    Pulse (!) 55    Temp 98.5 F (36.9 C)    Resp (!) 23    Ht 6\' 1"  (1.854 m)    Wt 132.9 kg    SpO2 97%    BMI 38.66 kg/m   Physical Exam Vitals signs reviewed.  Constitutional:      General: He is not in acute distress.    Appearance: He is well-developed. He is not diaphoretic.  HENT:     Head: Normocephalic and atraumatic.     Nose: Nose normal.  Eyes:     General: No scleral icterus.       Right eye: No discharge.        Left eye: No discharge.     Conjunctiva/sclera: Conjunctivae normal.     Pupils: Pupils are equal, round, and  reactive to light.  Neck:     Musculoskeletal: Normal range of motion and neck supple.  Cardiovascular:     Rate and Rhythm: Normal rate and regular rhythm.     Heart sounds: No murmur. No friction rub. No gallop.   Pulmonary:     Effort: Pulmonary effort is normal. No respiratory distress.     Breath sounds: Normal breath sounds. No stridor. No rales.  Abdominal:     General: There is no distension.     Palpations: Abdomen is soft.     Tenderness: There is no abdominal tenderness.  Musculoskeletal:        General: No tenderness.     Right lower leg: No edema.     Left lower leg: No edema.  Skin:    General: Skin is warm and dry.     Findings: No erythema or rash.  Neurological:     Mental Status: He is alert and oriented to person, place, and time.     ED Results and Treatments Labs (all labs ordered are listed, but only abnormal results are displayed) Labs Reviewed  COMPREHENSIVE METABOLIC PANEL - Abnormal; Notable for the following components:      Result Value   CO2 21 (*)    Glucose, Bld 117 (*)    Calcium 8.7 (*)    All other components within normal limits  LIPASE, BLOOD  CBC WITH DIFFERENTIAL/PLATELET  D-DIMER, QUANTITATIVE (NOT AT [facility])  TROPONIN I (HIGH SENSITIVITY)  TROPONIN I (HIGH SENSITIVITY)  EKG  EKG Interpretation  Date/Time:  Thursday May 10 2019 23:05:26 EDT Ventricular Rate:  56 PR Interval:    QRS Duration: 84 QT Interval:  403 QTC Calculation: 389 R Axis:   -8 Text Interpretation:  Sinus rhythm RSR' in V1 or V2, right VCD or RVH No significant change since last tracing Confirmed by Addison Lank [phone]) on 05/10/2019 11:17:55 PM      Radiology Dg Chest 2 View  Result Date: 05/11/2019 CLINICAL DATA:  63 year old male with chest pain. EXAM: CHEST - 2 VIEW COMPARISON:  Chest radiograph dated 02/25/2017 FINDINGS: Mild  diffuse interstitial prominence and chronic bronchitic changes. No focal consolidation, pleural effusion, pneumothorax. Stable cardiac silhouette. No acute osseous pathology. IMPRESSION: No active cardiopulmonary disease. Electronically Signed   By: Anner Crete M.D.   On: 05/11/2019 00:00    Pertinent labs & imaging results that were available during my care of the patient were reviewed by me and considered in my medical decision making (see chart for details).  Medications Ordered in ED Medications  sodium chloride 0.9 % bolus 1,000 mL (0 mLs Intravenous Stopped 05/11/19 0047)    Followed by  0.9 %  sodium chloride infusion (1,000 mLs Intravenous New Bag/Given 05/11/19 0146)  fentaNYL (SUBLIMAZE) injection 50 mcg (50 mcg Intravenous Given 05/11/19 0020)                                                                                                                                    Procedures Procedures  (including critical care time)  Medical Decision Making / ED Course I have reviewed the nursing notes for this encounter and the patient's prior records (if available in EHR or on provided paperwork).   Steven Powers was evaluated in Emergency Department on 05/11/2019 for the symptoms described in the history of present illness. He was evaluated in the context of the global COVID-19 pandemic, which necessitated consideration that the patient might be at risk for infection with the SARS-CoV-2 virus that causes COVID-19. Institutional protocols and algorithms that pertain to the evaluation of patients at risk for COVID-19 are in a state of rapid change based on information released by regulatory bodies including the CDC and federal and state organizations. These policies and algorithms were followed during the patient's care in the ED.  Atypical chest pain.  EKG without acute ischemic changes or evidence of pericarditis.  Heart score of 2.  Appropriate for delta troponin.  Initial  troponin negative.  Delta troponin stable.  This rules out ACS.  Low suspicion for PE but unable to [facility] out.  Dimer negative.  Highly doubt pulmonary embolism.  Presentation not classic for aortic dissection or esophageal perforation.  Chest x-ray without evidence suggestive of pneumonia, pneumothorax, pneumomediastinum.  No abnormal contour of the mediastinum to suggest dissection. No evidence of acute injuries.  The patient appears reasonably screened and/or stabilized for discharge and I doubt any other medical  condition or other [facility] requiring further screening, evaluation, or treatment in the ED at this time prior to discharge.  The patient is safe for discharge with strict return precautions.       Final Clinical Impression(s) / ED Diagnoses Final diagnoses:  Chest pain     The patient appears reasonably screened and/or stabilized for discharge and I doubt any other medical condition or other [facility] requiring further screening, evaluation, or treatment in the ED at this time prior to discharge.  Disposition: Discharge  Condition: Good  I have discussed the results, Dx and Tx plan with the patient who expressed understanding and agree(s) with the plan. Discharge instructions discussed at great length. The patient was given strict return precautions who verbalized understanding of the instructions. No further questions at time of discharge.    ED Discharge Orders    None       Follow Up: [facility], [name] [address] [place] [address] [phone]  Schedule an appointment as soon as possible for a visit  As needed    This chart was dictated using voice recognition software.  Despite best efforts to proofread,  errors can occur which can change the documentation meaning.   Fatima Blank, MD 05/11/19 [phone]

## 2019-05-11 LAB — COMPREHENSIVE METABOLIC PANEL
ALT: 21 U/L (ref 0–44)
AST: 24 U/L (ref 15–41)
Albumin: 3.6 g/dL (ref 3.5–5.0)
Alkaline Phosphatase: 49 U/L (ref 38–126)
Anion gap: 11 (ref 5–15)
BUN: 12 mg/dL (ref 8–23)
CO2: 21 mmol/L — ABNORMAL LOW (ref 22–32)
Calcium: 8.7 mg/dL — ABNORMAL LOW (ref 8.9–10.3)
Chloride: 109 mmol/L (ref 98–111)
Creatinine, Ser: 1.1 mg/dL (ref 0.61–1.24)
GFR calc Af Amer: >60 mL/min (ref >60)
GFR calc non Af Amer: >60 mL/min (ref >60)
Glucose, Bld: 117 mg/dL — ABNORMAL HIGH (ref 70–99)
Potassium: 4.2 mmol/L (ref 3.5–5.1)
Sodium: 141 mmol/L (ref 135–145)
Total Bilirubin: 0.5 mg/dL (ref 0.3–1.2)
Total Protein: 6.5 g/dL (ref 6.5–8.1)

## 2019-05-11 LAB — CBC WITH DIFFERENTIAL/PLATELET
Abs Immature Granulocytes: 0.01 10*3/uL (ref 0.00–0.07)
Basophils Absolute: 0 10*3/uL (ref 0.0–0.1)
Basophils Relative: 0 %
Eosinophils Absolute: 0.1 10*3/uL (ref 0.0–0.5)
Eosinophils Relative: 1 %
HCT: 42.5 % (ref 39.0–52.0)
Hemoglobin: 14.4 g/dL (ref 13.0–17.0)
Immature Granulocytes: 0 %
Lymphocytes Relative: 15 %
Lymphs Abs: 1.3 10*3/uL (ref 0.7–4.0)
MCH: 29.4 pg (ref 26.0–34.0)
MCHC: 33.9 g/dL (ref 30.0–36.0)
MCV: 86.9 fL (ref 80.0–100.0)
Monocytes Absolute: 0.5 10*3/uL (ref 0.1–1.0)
Monocytes Relative: 6 %
Neutro Abs: 6.5 10*3/uL (ref 1.7–7.7)
Neutrophils Relative %: 78 %
Platelets: 174 10*3/uL (ref 150–400)
RBC: 4.89 MIL/uL (ref 4.22–5.81)
RDW: 12.5 % (ref 11.5–15.5)
WBC: 8.5 10*3/uL (ref 4.0–10.5)
nRBC: 0 % (ref 0.0–0.2)

## 2019-05-11 LAB — D-DIMER, QUANTITATIVE: D-Dimer, Quant: 0.3 ug/mL-FEU (ref 0.00–0.50)

## 2019-05-11 LAB — TROPONIN I (HIGH SENSITIVITY)
Troponin I (High Sensitivity): 6 ng/L (ref <18)
Troponin I (High Sensitivity): 7 ng/L (ref <18)

## 2019-05-11 LAB — LIPASE, BLOOD: Lipase: 33 U/L (ref 11–51)

## 2019-05-11 NOTE — ED Notes (Signed)
CBC tube clotted, need redraw.  RN notified.

## 2019-11-29 ENCOUNTER — Other Ambulatory Visit: Payer: Self-pay

## 2019-11-29 ENCOUNTER — Telehealth (INDEPENDENT_AMBULATORY_CARE_PROVIDER_SITE_OTHER): Payer: Self-pay | Admitting: Gastroenterology

## 2019-11-29 DIAGNOSIS — Z86010 Personal history of colonic polyps: Secondary | ICD-10-CM

## 2019-11-29 NOTE — Progress Notes (Signed)
Gastroenterology Pre-Procedure Review  Request Date: Tuesday 12/18/19 Requesting Physician: Dr. Vicente Males  PATIENT REVIEW QUESTIONS: The patient responded to the following health history questions as indicated:    1. Are you having any GI issues? yes (hiccups alot) 2. Do you have a personal history of Polyps? yes ([facility] one [facility] were [facility]) 3. Do you have a family history of Colon Cancer or Polyps? no 4. Diabetes Mellitus? no 5. Joint replacements in the past 12 months?no 6. Major health problems in the past 3 months?no 7. Any artificial heart valves, MVP, or defibrillator?no    MEDICATIONS & ALLERGIES:    Patient reports the following regarding taking any anticoagulation/antiplatelet therapy:   Plavix, Coumadin, Eliquis, Xarelto, Lovenox, Pradaxa, Brilinta, or Effient? no Aspirin? no  Patient confirms/reports the following medications:  Current Outpatient Medications  Medication Sig Dispense Refill  . albuterol (PROVENTIL HFA;VENTOLIN HFA) 108 (90 BASE) MCG/ACT inhaler Inhale 2 puffs into the lungs every 6 (six) hours as needed for wheezing. 1 Inhaler 2  . atenolol (TENORMIN) 25 MG tablet Take 25 mg by mouth daily.    . diclofenac (VOLTAREN) 75 MG EC tablet Take 75 mg by mouth 2 (two) times daily.    Marland Kitchen FAMOTIDINE PO Take 40 mg by mouth.    . pantoprazole (PROTONIX) 40 MG tablet Take 40 mg by mouth daily.    Marland Kitchen aspirin 81 MG tablet Take 81 mg by mouth daily.     Marland Kitchen ibuprofen (ADVIL) 800 MG tablet Take 800 mg by mouth every 8 (eight) hours as needed for mild pain.     . meclizine (ANTIVERT) 25 MG tablet Take 1 tablet (25 mg total) by mouth 3 (three) times daily as needed for dizziness. (Patient not taking: Reported on 05/11/2019) 30 tablet 0  . promethazine (PHENERGAN) 12.5 MG tablet Take 2 tablets (25 mg total) by mouth every 6 (six) hours as needed for nausea or vomiting. (Patient not taking: Reported on 05/11/2019) 20 tablet 0   No current  facility-administered medications for this visit.    Patient confirms/reports the following allergies:  No Known Allergies  No orders of the defined types were placed in this encounter.   AUTHORIZATION INFORMATION Primary Insurance: 1D#: Group #:  Secondary Insurance: 1D#: Group #:  SCHEDULE INFORMATION: Date:  Tuesday 12/18/19 Time: Location:[facility]

## 2019-12-14 ENCOUNTER — Other Ambulatory Visit
Admission: RE | Admit: 2019-12-14 | Discharge: 2019-12-14 | Disposition: A | Discharge status: Home / Self Care | Payer: No Typology Code available for payment source | Source: Ambulatory Visit | Attending: Gastroenterology | Admitting: Gastroenterology

## 2019-12-14 DIAGNOSIS — Z01812 Encounter for preprocedural laboratory examination: Secondary | ICD-10-CM | POA: Insufficient documentation

## 2019-12-14 DIAGNOSIS — Z20822 Contact with and (suspected) exposure to covid-19: Secondary | ICD-10-CM | POA: Diagnosis not present

## 2019-12-14 LAB — SARS CORONAVIRUS 2 (TAT 6-24 HRS): SARS Coronavirus 2: NEGATIVE

## 2019-12-18 ENCOUNTER — Ambulatory Visit
Admission: RE | Admit: 2019-12-18 | Discharge: 2019-12-18 | Disposition: A | Discharge status: Home / Self Care | Payer: No Typology Code available for payment source | Source: Ambulatory Visit | Attending: Gastroenterology | Admitting: Gastroenterology

## 2019-12-18 ENCOUNTER — Ambulatory Visit: Payer: No Typology Code available for payment source | Admitting: Anesthesiology

## 2019-12-18 ENCOUNTER — Encounter: Payer: Self-pay | Admitting: Gastroenterology

## 2019-12-18 ENCOUNTER — Other Ambulatory Visit: Payer: Self-pay

## 2019-12-18 ENCOUNTER — Encounter
Admission: RE | Disposition: A | Discharge status: Home / Self Care | Payer: Self-pay | Source: Ambulatory Visit | Attending: Gastroenterology

## 2019-12-18 DIAGNOSIS — I10 Essential (primary) hypertension: Secondary | ICD-10-CM | POA: Diagnosis not present

## 2019-12-18 DIAGNOSIS — D122 Benign neoplasm of ascending colon: Secondary | ICD-10-CM | POA: Insufficient documentation

## 2019-12-18 DIAGNOSIS — R0602 Shortness of breath: Secondary | ICD-10-CM | POA: Insufficient documentation

## 2019-12-18 DIAGNOSIS — Z791 Long term (current) use of non-steroidal anti-inflammatories (NSAID): Secondary | ICD-10-CM | POA: Diagnosis not present

## 2019-12-18 DIAGNOSIS — K621 Rectal polyp: Secondary | ICD-10-CM | POA: Insufficient documentation

## 2019-12-18 DIAGNOSIS — I499 Cardiac arrhythmia, unspecified: Secondary | ICD-10-CM | POA: Diagnosis not present

## 2019-12-18 DIAGNOSIS — E119 Type 2 diabetes mellitus without complications: Secondary | ICD-10-CM | POA: Diagnosis not present

## 2019-12-18 DIAGNOSIS — Z86010 Personal history of colonic polyps: Secondary | ICD-10-CM | POA: Diagnosis not present

## 2019-12-18 DIAGNOSIS — K219 Gastro-esophageal reflux disease without esophagitis: Secondary | ICD-10-CM | POA: Insufficient documentation

## 2019-12-18 DIAGNOSIS — Z87891 Personal history of nicotine dependence: Secondary | ICD-10-CM | POA: Diagnosis not present

## 2019-12-18 DIAGNOSIS — M1990 Unspecified osteoarthritis, unspecified site: Secondary | ICD-10-CM | POA: Insufficient documentation

## 2019-12-18 DIAGNOSIS — J449 Chronic obstructive pulmonary disease, unspecified: Secondary | ICD-10-CM | POA: Diagnosis not present

## 2019-12-18 DIAGNOSIS — D123 Benign neoplasm of transverse colon: Secondary | ICD-10-CM | POA: Insufficient documentation

## 2019-12-18 DIAGNOSIS — D126 Benign neoplasm of colon, unspecified: Secondary | ICD-10-CM

## 2019-12-18 DIAGNOSIS — G4730 Sleep apnea, unspecified: Secondary | ICD-10-CM | POA: Insufficient documentation

## 2019-12-18 DIAGNOSIS — Z1211 Encounter for screening for malignant neoplasm of colon: Secondary | ICD-10-CM | POA: Insufficient documentation

## 2019-12-18 DIAGNOSIS — Z7982 Long term (current) use of aspirin: Secondary | ICD-10-CM | POA: Diagnosis not present

## 2019-12-18 DIAGNOSIS — I4891 Unspecified atrial fibrillation: Secondary | ICD-10-CM | POA: Insufficient documentation

## 2019-12-18 DIAGNOSIS — Z79899 Other long term (current) drug therapy: Secondary | ICD-10-CM | POA: Insufficient documentation

## 2019-12-18 HISTORY — DX: Essential (primary) hypertension: I10

## 2019-12-18 HISTORY — DX: Type 2 diabetes mellitus without complications: E11.9

## 2019-12-18 HISTORY — DX: Sleep apnea, unspecified: G47.30

## 2019-12-18 HISTORY — DX: Cardiac arrhythmia, unspecified: I49.9

## 2019-12-18 HISTORY — DX: Shortness of breath: R06.02

## 2019-12-18 LAB — GLUCOSE, CAPILLARY: Glucose-Capillary: 145 mg/dL — ABNORMAL HIGH (ref 70–99)

## 2019-12-18 SURGERY — COLONOSCOPY WITH PROPOFOL
Anesthesia: General

## 2019-12-18 MED ORDER — LIDOCAINE HCL (PF) 1 % IJ SOLN
INTRAMUSCULAR | Status: AC
  Filled 2019-12-18: qty 2

## 2019-12-18 MED ORDER — PROPOFOL 500 MG/50ML IV EMUL
INTRAVENOUS | Status: AC
  Filled 2019-12-18: qty 50

## 2019-12-18 MED ORDER — PROPOFOL 500 MG/50ML IV EMUL
INTRAVENOUS | Status: DC | PRN
  Administered 2019-12-18: 150 ug/kg/min via INTRAVENOUS

## 2019-12-18 MED ORDER — PROPOFOL 10 MG/ML IV BOLUS
INTRAVENOUS | Status: AC
  Filled 2019-12-18: qty 20

## 2019-12-18 MED ORDER — SODIUM CHLORIDE 0.9 % IV SOLN: INTRAVENOUS | Status: DC

## 2019-12-18 NOTE — H&P (Signed)
Steven Bellows, MD [address] [place], [place], [place], [address] [address], [place], [place], [place], [address] Phone: [phone]  Fax: [phone]  Primary Care Physician:  [name]   Pre-Procedure History & Physical: HPI:  Steven Powers is a 64 y.o. male is here for an colonoscopy.   Past Medical History:  Diagnosis Date  . Arthritis   . Diabetes mellitus without complication ([place])   . Dysrhythmia    a fib with ablation "a couple of years ago"  . Hypertension   . Irregular heart beat   . Sleep apnea   . SOB (shortness of breath)     Past Surgical History:  Procedure Laterality Date  . shoulder surgery left      Prior to Admission medications   Medication Sig Start Date End Date Taking? Authorizing Provider  albuterol (PROVENTIL HFA;VENTOLIN HFA) 108 (90 BASE) MCG/ACT inhaler Inhale 2 puffs into the lungs every 6 (six) hours as needed for wheezing. 11/05/12  Yes Pollina, Gwenyth Allegra, MD  aspirin 81 MG tablet Take 81 mg by mouth daily.    Yes [provider]  atenolol (TENORMIN) 25 MG tablet Take 25 mg by mouth daily.   Yes [provider]  ibuprofen (ADVIL) 800 MG tablet Take 800 mg by mouth every 8 (eight) hours as needed for mild pain.    Yes [provider]  diclofenac (VOLTAREN) 75 MG EC tablet Take 75 mg by mouth 2 (two) times daily.    [provider]  FAMOTIDINE PO Take 40 mg by mouth.    [provider]  meclizine (ANTIVERT) 25 MG tablet Take 1 tablet (25 mg total) by mouth 3 (three) times daily as needed for dizziness. Patient not taking: Reported on 05/11/2019 02/25/17   Arnetha Massy, MD  pantoprazole (PROTONIX) 40 MG tablet Take 40 mg by mouth daily.    [provider]  promethazine (PHENERGAN) 12.5 MG tablet Take 2 tablets (25 mg total) by mouth every 6 (six) hours as needed for nausea or vomiting. Patient not taking: Reported on 05/11/2019 02/25/17   Arnetha Massy, MD     Allergies as of 11/29/2019  . (No Known Allergies)    History reviewed. No pertinent family history.  Social History   Socioeconomic History  . Marital status: Single    Spouse name: Not on file  . Number of children: Not on file  . Years of education: Not on file  . Highest education level: Not on file  Occupational History  . Not on file  Tobacco Use  . Smoking status: Former Smoker    Packs/day: 0.50    Types: Cigarettes    Quit date: 12/18/2015    Years since quitting: 4.0  . Smokeless tobacco: Never Used  Substance and Sexual Activity  . Alcohol use: No  . Drug use: No  . Sexual activity: Not on file  Other Topics Concern  . Not on file  Social History Narrative  . Not on file   Social Determinants of Health   Financial Resource Strain:   . Difficulty of Paying Living Expenses:   Food Insecurity:   . Worried About Charity fundraiser in the Last Year:   . Arboriculturist in the Last Year:   Transportation Needs:   . Film/video editor (Medical):   Marland Kitchen Lack of Transportation (Non-Medical):   Physical Activity:   . Days of Exercise per Week:   . Minutes of Exercise per  Session:   Stress:   . Feeling of Stress :   Social Connections:   . Frequency of Communication with Friends and Family:   . Frequency of Social Gatherings with Friends and Family:   . Attends Religious Services:   . Active Member of Clubs or Organizations:   . Attends Archivist Meetings:   Marland Kitchen Marital Status:   Intimate Partner Violence:   . Fear of Current or Ex-Partner:   . Emotionally Abused:   Marland Kitchen Physically Abused:   . Sexually Abused:     Review of Systems: See HPI, otherwise negative ROS  Physical Exam: BP (!) 195/113   Pulse (!) 53   Temp (!) 97.3 F (36.3 C) (Temporal)   Resp 16   Ht 6\' 1"  (1.854 m)   Wt 131.5 kg   SpO2 95%   BMI 38.26 kg/m  General:   Alert,  pleasant and cooperative in NAD Head:  Normocephalic and atraumatic. Neck:  Supple; no  masses or thyromegaly. Lungs:  Clear throughout to auscultation, normal respiratory effort.    Heart:  +S1, +S2, Regular rate and rhythm, No edema. Abdomen:  Soft, nontender and nondistended. Normal bowel sounds, without guarding, and without rebound.   Neurologic:  Alert and  oriented x4;  grossly normal neurologically.  Impression/Plan: [name] is here for an colonoscopy to be performed for surveillance due to prior history of colon polyps   Risks, benefits, limitations, and alternatives regarding  colonoscopy have been reviewed with the patient.  Questions have been answered.  All parties agreeable.   Steven Bellows, MD  12/18/2019, 9:35 AM

## 2019-12-18 NOTE — Anesthesia Preprocedure Evaluation (Addendum)
Anesthesia Evaluation  Patient identified by MRN, date of birth, ID band Patient awake    Reviewed: Allergy & Precautions, H&P , NPO status , Patient's Chart, lab work & pertinent test results, reviewed documented beta blocker date and time   History of Anesthesia Complications Negative for: history of anesthetic complications  Airway Mallampati: IV  TM Distance: >3 FB Neck ROM: full    Dental  (+) Dental Advidsory Given, Partial Upper, Teeth Intact   Pulmonary shortness of breath and with exertion, sleep apnea , COPD (mild), neg recent URI, former smoker,    Pulmonary exam normal breath sounds clear to auscultation       Cardiovascular Exercise Tolerance: Good hypertension, (-) angina(-) Past MI and (-) Cardiac Stents + dysrhythmias (s/p ablation) Atrial Fibrillation (-) Valvular Problems/Murmurs Rhythm:regular Rate:Normal     Neuro/Psych negative neurological ROS  negative psych ROS   GI/Hepatic Neg liver ROS, GERD  ,  Endo/Other  diabetes  Renal/GU negative Renal ROS  negative genitourinary   Musculoskeletal   Abdominal   Peds  Hematology negative hematology ROS (+)   Anesthesia Other Findings Past Medical History: No date: Arthritis No date: Diabetes mellitus without complication ([facility]) No date: Dysrhythmia     Comment:  a fib with ablation "a couple of years ago" No date: Hypertension No date: Irregular heart beat No date: Sleep apnea No date: SOB (shortness of breath)   Reproductive/Obstetrics negative OB ROS                            Anesthesia Physical Anesthesia Plan  ASA: III  Anesthesia Plan: General   Post-op Pain Management:    Induction: Intravenous  PONV Risk Score and Plan: 2 and Propofol infusion and TIVA  Airway Management Planned: Natural Airway and Nasal Cannula  Additional Equipment:   Intra-op Plan:   Post-operative Plan:   Informed Consent: I  have reviewed the patients History and Physical, chart, labs and discussed the procedure including the risks, benefits and alternatives for the proposed anesthesia with the patient or authorized representative who has indicated his/her understanding and acceptance.     Dental Advisory Given  Plan Discussed with: Anesthesiologist, CRNA and Surgeon  Anesthesia Plan Comments:         Anesthesia Quick Evaluation

## 2019-12-18 NOTE — Transfer of Care (Signed)
Immediate Anesthesia Transfer of Care Note  Patient: Steven Powers  Procedure(s) Performed: COLONOSCOPY WITH PROPOFOL (N/A )  Patient Location: PACU  Anesthesia Type:General  Level of Consciousness: awake and sedated  Airway & Oxygen Therapy: Patient Spontanous Breathing and Patient connected to face mask oxygen  Post-op Assessment: Report given to RN and Post -op Vital signs reviewed and stable  Post vital signs: Reviewed and stable  Last Vitals:  Vitals Value Taken Time  BP    Temp    Pulse    Resp    SpO2      Last Pain:  Vitals:   12/18/19 0832  TempSrc: Temporal  PainSc: 0-No pain         Complications: No apparent anesthesia complications

## 2019-12-18 NOTE — Anesthesia Postprocedure Evaluation (Signed)
Anesthesia Post Note  Patient: Steven Powers  Procedure(s) Performed: COLONOSCOPY WITH PROPOFOL (N/A )  Patient location during evaluation: Endoscopy Anesthesia Type: General Level of consciousness: awake and alert Pain management: pain level controlled Vital Signs Assessment: post-procedure vital signs reviewed and stable Respiratory status: spontaneous breathing, nonlabored ventilation, respiratory function stable and patient connected to nasal cannula oxygen Cardiovascular status: blood pressure returned to baseline and stable Postop Assessment: no apparent nausea or vomiting Anesthetic complications: no     Last Vitals:  Vitals:   12/18/19 1030 12/18/19 1040  BP: (!) 106/59 127/70  Pulse: (!) 58 (!) 55  Resp: (!) 24 20  Temp:    SpO2: 94% 94%    Last Pain:  Vitals:   12/18/19 1010  TempSrc: Temporal  PainSc:                  [name]

## 2019-12-18 NOTE — Op Note (Signed)
[facility] Gastroenterology Patient Name: Steven Powers Procedure Date: 12/18/2019 9:41 AM MRN: [id number] Account #: [id number] Date of Birth: 1956-03-27 Admit Type: Outpatient Age: 64 Room: [facility] ENDO ROOM 1 Gender: Male Note Status: Finalized Procedure:             Colonoscopy Indications:           High risk colon cancer surveillance: Personal history                         of colonic polyps Providers:             Jonathon Bellows MD, MD Referring MD:          No Local Md, MD (Referring MD) Medicines:             Monitored Anesthesia Care Complications:         No immediate complications. Procedure:             Pre-Anesthesia Assessment:                        - Prior to the procedure, a History and Physical was                         performed, and patient medications, allergies and                         sensitivities were reviewed. The patient's tolerance                         of previous anesthesia was reviewed.                        - The risks and benefits of the procedure and the                         sedation options and risks were discussed with the                         patient. All questions were answered and informed                         consent was obtained.                        - ASA Grade Assessment: II - A patient with mild                         systemic disease.                        After obtaining informed consent, the colonoscope was                         passed under direct vision. Throughout the procedure,                         the patient's blood pressure, pulse, and oxygen                         saturations were monitored continuously. The  Colonoscope was introduced through the anus and                         advanced to the the cecum, identified by the                         appendiceal orifice. The colonoscopy was performed                         without difficulty. The patient tolerated  the                         procedure well. The quality of the bowel preparation                         was poor. Findings:      The perianal and digital rectal examinations were normal.      A 5 mm polyp was found in the rectum. The polyp was sessile. The polyp       was removed with a cold snare. Resection and retrieval were complete.      A 12 mm polyp was found in the transverse colon. The polyp was       semi-sessile. The polyp was removed with a hot snare. Resection and       retrieval were complete.      Two sessile polyps were found in the ascending colon. The polyps were 5       to 7 mm in size. These polyps were removed with a cold snare. Resection       and retrieval were complete.      A 15 mm polyp was found in the ascending colon. The polyp was       semi-pedunculated. The polyp was removed with a hot snare. Resection and       retrieval were complete.      The exam was otherwise without abnormality. Impression:            - Preparation of the colon was poor.                        - One 5 mm polyp in the rectum, removed with a cold                         snare. Resected and retrieved.                        - One 12 mm polyp in the transverse colon, removed                         with a hot snare. Resected and retrieved.                        - Two 5 to 7 mm polyps in the ascending colon, removed                         with a cold snare. Resected and retrieved.                        - One 15 mm polyp in the ascending colon,  removed with                         a hot snare. Resected and retrieved.                        - The examination was otherwise normal. Recommendation:        - Discharge patient to home (with escort).                        - Resume previous diet.                        - Continue present medications.                        - Await pathology results.                        - Repeat colonoscopy in 5 months because the bowel                          preparation was suboptimal. Procedure Code(s):     --- Professional ---                        [phone], Colonoscopy, flexible; with removal of                         tumor(s), polyp(s), or other lesion(s) by snare                         technique Diagnosis Code(s):     --- Professional ---                        Z86.010, Personal history of colonic polyps                        K62.1, Rectal polyp                        K63.5, Polyp of colon CPT copyright 2019 American Medical Association. All rights reserved. The codes documented in this report are preliminary and upon coder review may  be revised to meet current compliance requirements. Jonathon Bellows, MD Jonathon Bellows MD, MD 12/18/2019 10:11:37 AM This report has been signed electronically. Number of Addenda: 0 Note Initiated On: 12/18/2019 9:41 AM Scope Withdrawal Time: 0 hours 10 minutes 28 seconds  Total Procedure Duration: 0 hours 20 minutes 1 second  Estimated Blood Loss:  Estimated blood loss: none.      [facility]

## 2019-12-18 NOTE — Anesthesia Procedure Notes (Signed)
Performed by: Cook-Martin, Cindy Pre-anesthesia Checklist: Patient identified, Emergency Drugs available, Suction available, Patient being monitored and Timeout performed Oxygen Delivery Method: Supernova nasal CPAP Preoxygenation: Pre-oxygenation with 100% oxygen Induction Type: IV induction Placement Confirmation: positive ETCO2 and CO2 detector       

## 2019-12-19 ENCOUNTER — Encounter: Payer: Self-pay | Admitting: *Deleted

## 2019-12-19 LAB — SURGICAL PATHOLOGY

## 2019-12-25 ENCOUNTER — Telehealth: Payer: Self-pay

## 2019-12-25 NOTE — Telephone Encounter (Signed)
-----   Message from Jonathon Bellows, MD sent at 12/25/2019  9:37 AM EDT ----- 2 out of 3 polyps were adenomas.  Prep was suboptimal hence suggest repeat colonoscopy in 5 months with a 2-day prep

## 2019-12-25 NOTE — Telephone Encounter (Signed)
Spoke with pt and informed him of biopsy results and Dr. Georgeann Oppenheim suggestion. Pt understands and agrees.

## 2020-01-03 ENCOUNTER — Telehealth: Payer: Self-pay

## 2020-01-03 NOTE — Telephone Encounter (Signed)
[facility] is [facility] are needing patient colonoscopy report and Path report faxed to them from 12/18/2019. The fax number is [phone]

## 2020-01-03 NOTE — Telephone Encounter (Signed)
Records have been faxed.

## 2020-08-07 ENCOUNTER — Telehealth: Payer: Self-pay

## 2020-08-07 NOTE — Telephone Encounter (Signed)
NOTES ON FILE [facility] [phone] EXT [phone] SENT REFERRAL TO SCHEDULING

## 2020-08-28 ENCOUNTER — Ambulatory Visit (INDEPENDENT_AMBULATORY_CARE_PROVIDER_SITE_OTHER): Payer: No Typology Code available for payment source | Admitting: Cardiology

## 2020-08-28 ENCOUNTER — Other Ambulatory Visit: Payer: Self-pay

## 2020-08-28 ENCOUNTER — Encounter: Payer: Self-pay | Admitting: Cardiology

## 2020-08-28 VITALS — BP 140/84 | HR 70 | Ht 73.0 in | Wt 294.4 lb

## 2020-08-28 DIAGNOSIS — I4819 Other persistent atrial fibrillation: Secondary | ICD-10-CM

## 2020-08-28 NOTE — Progress Notes (Signed)
Electrophysiology Office Note   Date:  08/28/2020   ID:  Collier, Bohnet October 13, 1955, MRN [id number]  PCP:  [name]  Cardiologist: Shanda Howells Primary Electrophysiologist:   Tkai Large [name], MD    Chief Complaint: Atrial fibrillation   History of Present Illness: PHILL STECK is a 65 y.o. male who is being seen today for the evaluation of atrial fibrillation at the request of [name]. Presenting today for electrophysiology evaluation.  He has a history significant for diabetes, hypertension, sleep apnea, and atrial fibrillation.  He also has a history of atrial flutter status post ablation in 2012 at the [facility].  His atrial fibrillation was diagnosed June 2021 when he went to the emergency room to be evaluated for back pain and weakness.  Echocardiogram showed an EF of 50 to 55%, mildly dilated atria.  He was started on flecainide and had a successful cardioversion 06/03/2020.  His AF symptoms include palpitations and dyspnea on exertion.  He has no chest pain.  Unfortunately he has developed headaches and insomnia since starting flecainide.  Today, he denies symptoms of palpitations, chest pain, shortness of breath, orthopnea, PND, lower extremity edema, claudication, dizziness, presyncope, syncope, bleeding, or neurologic sequela. The patient is tolerating medications without difficulties.    Past Medical History:  Diagnosis Date  . Arthritis   . Diabetes mellitus without complication ([place])   . Dysrhythmia    a fib with ablation "a couple of years ago"  . Hypertension   . Irregular heart beat   . Sleep apnea   . SOB (shortness of breath)    Past Surgical History:  Procedure Laterality Date  . COLONOSCOPY WITH PROPOFOL N/A 12/18/2019   Procedure: COLONOSCOPY WITH PROPOFOL;  Surgeon: Jonathon Bellows, MD;  Location: [facility] ENDOSCOPY;  Service: Gastroenterology;  Laterality: N/A;  . shoulder surgery left       Current Outpatient  Medications  Medication Sig Dispense Refill  . albuterol (PROVENTIL HFA;VENTOLIN HFA) 108 (90 BASE) MCG/ACT inhaler Inhale 2 puffs into the lungs every 6 (six) hours as needed for wheezing. 1 Inhaler 2  . allopurinol (ZYLOPRIM) 100 MG tablet Take 1 tablet by mouth daily.    Marland Kitchen apixaban (ELIQUIS) 5 MG TABS tablet Take 1 tablet by mouth 2 (two) times daily.    Marland Kitchen aspirin 81 MG tablet Take 81 mg by mouth daily.     . carboxymethylcellulose (REFRESH PLUS) 0.5 % SOLN Place 1 drop into both eyes as needed.    . clotrimazole (LOTRIMIN) 1 % external solution Apply 1 application topically as needed.    . diclofenac (VOLTAREN) 75 MG EC tablet Take 75 mg by mouth 2 (two) times daily.    Marland Kitchen diltiazem (TIAZAC) 240 MG 24 hr capsule Take 1 capsule by mouth daily.    . famotidine (PEPCID) 40 MG tablet Take 1 tablet by mouth at bedtime.    Marland Kitchen ibuprofen (ADVIL) 800 MG tablet Take 800 mg by mouth every 8 (eight) hours as needed for mild pain.     Marland Kitchen latanoprost (XALATAN) 0.005 % ophthalmic solution Place 1 drop into both eyes at bedtime.    . pantoprazole (PROTONIX) 40 MG tablet Take 40 mg by mouth daily.    . pravastatin (PRAVACHOL) 10 MG tablet Take 1 tablet by mouth at bedtime.    . Tiotropium Bromide Monohydrate 1.25 MCG/ACT AERS Inhale 2 puffs into the lungs as needed.     No current facility-administered medications for this visit.  Allergies:   Patient has no known allergies.   Social History:  The patient  reports that he quit smoking about 4 years ago. His smoking use included cigarettes. He smoked 0.50 packs per day. He has never used smokeless tobacco. He reports that he does not drink alcohol and does not use drugs.   Family History:  The patient's family history includes Diabetes in his brother.    ROS:  Please see the history of present illness.   Otherwise, review of systems is positive for none.   All other systems are reviewed and negative.    PHYSICAL EXAM: VS:  BP 140/84   Pulse 70   Ht  6\' 1"  (1.854 m)   Wt 294 lb 6.4 oz (133.5 kg)   SpO2 94%   BMI 38.84 kg/m  , BMI Body mass index is 38.84 kg/m. GEN: Well nourished, well developed, in no acute distress  HEENT: normal  Neck: no JVD, carotid bruits, or masses Cardiac: RRR; no murmurs, rubs, or gallops,no edema  Respiratory:  clear to auscultation bilaterally, normal work of breathing GI: soft, nontender, nondistended, + BS MS: no deformity or atrophy  Skin: warm and dry Neuro:  Strength and sensation are intact Psych: euthymic mood, full affect  EKG:  EKG is ordered today. Personal review of the ekg ordered shows sinus rhythm, rate 70  Recent Labs: No results found for requested labs within last 8760 hours.    Lipid Panel  No results found for: CHOL, TRIG, HDL, CHOLHDL, VLDL, LDLCALC, LDLDIRECT   Wt Readings from Last 3 Encounters:  08/28/20 294 lb 6.4 oz (133.5 kg)  12/18/19 290 lb (131.5 kg)  05/10/19 293 lb (132.9 kg)      Other studies Reviewed: Additional studies/ records that were reviewed today include: TTE July 2021 Review of the above records today demonstrates:  Mildly dilated right atrium                                       Ejection fraction 50 to 55%    ASSESSMENT AND PLAN:  1.  Persistent atrial fibrillation: Has been on flecainide but had unfortunate side effects.  Has a history of atrial flutter and is now status post ablation in 2012.  He continues to have episodes of atrial fibrillation and is symptomatic.  At this point, he would prefer to avoid medication.  We Katheen Aslin plan for ablation.  Risk and benefits were discussed which were bleeding, tamponade, heart block, stroke, damage to chest organs.  He understands these risks and has agreed to the procedure.  2.  Obstructive sleep apnea: CPAP compliance encouraged  Current medicines are reviewed at length with the patient today.   The patient does not have concerns regarding his medicines.  The following changes were made today:   none  Labs/ tests ordered today include:  Orders Placed This Encounter  Procedures  . EKG 12-Lead     Disposition:   FU with Riyansh Gerstner 3 months  Signed, Susano Cleckler [name], MD  08/28/2020 12:43 PM     [place] [address] [phone] (office) [phone] (fax)

## 2020-08-28 NOTE — Patient Instructions (Signed)
Medication Instructions:  Your physician recommends that you continue on your current medications as directed. Please refer to the Current Medication list given to you today.  *If you need a refill on your cardiac medications before your next appointment, please call your pharmacy*   Lab Work: None ordered If you have labs (blood work) drawn today and your tests are completely normal, you will receive your results only by: Marland Kitchen MyChart Message (if you have MyChart) OR . A paper copy in the mail If you have any lab test that is abnormal or we need to change your treatment, we will call you to review the results.   Testing/Procedures: Your physician has requested that you have cardiac CT within 7 days PRIOR to your ablation. Cardiac computed tomography (CT) is a painless test that uses an x-ray machine to take clear, detailed pictures of your heart. Please follow the instructions below under "other instructions".    Your physician has recommended that you have an ablation. Catheter ablation is a medical procedure used to treat some cardiac arrhythmias (irregular heartbeats). During catheter ablation, a long, thin, flexible tube is put into a blood vessel in your groin (upper thigh), or neck. This tube is called an ablation catheter. It is then guided to your heart through the blood vessel. Radio frequency waves destroy small areas of heart tissue where abnormal heartbeats may cause an arrhythmia to start. Please follow the instructions below under "other instructions".   The following dates are available (these are subject to change): 4/6, 4/8,  4/13, 4/15, 4/19, 4/22, 4/27, 4/28, 4/29  Follow-Up: At [facility], you and your health needs are our priority.  As part of our continuing mission to provide you with exceptional heart care, we have created designated Provider Care Teams.  These Care Teams include your primary Cardiologist (physician) and Advanced Practice Providers (APPs -  Physician  Assistants and Nurse Practitioners) who all work together to provide you with the care you need, when you need it.  We recommend signing up for the patient portal called "MyChart".  Sign up information is provided on this After Visit Summary.  MyChart is used to connect with patients for Virtual Visits (Telemedicine).  Patients are able to view lab/test results, encounter notes, upcoming appointments, etc.  Non-urgent messages can be sent to your provider as well.   To learn more about what you can do with MyChart, go to [email or web address].    Your next appointment:    to be determined   The format for your next appointment:   In Person  Provider:   Allegra Lai, MD    Thank you for choosing [facility]!!   Trinidad Curet, RN [phone]   Other Instructions  CT INSTRUCTIONS Your cardiac CT will be scheduled at one of the below locations:   [facility] [address] [place], [place] [address] [phone]  [facility] [address] [place], [place] [address] [phone]  If scheduled at [facility], please arrive at the [facility] main entrance of [facility] 30 minutes prior to test start time. Proceed to the [facility] Department (first floor) to check-in and test prep.  If scheduled at [facility], please arrive 15 mins early for check-in and test prep.  Please follow these instructions carefully (unless otherwise directed):  Hold all erectile dysfunction medications at least 3 days (72 hrs) prior to test.  On the Night  Before the Test: . Be sure to Drink plenty of water. . Do not consume any caffeinated/decaffeinated beverages or chocolate 12 hours prior to your test. . Do not take any antihistamines 12 hours prior to your test.  On the Day of the Test: . Drink plenty of water. Do not drink any water within one hour of the  test. . Do not eat any food 4 hours prior to the test. . You may take your regular medications prior to the test.  . Take metoprolol (Lopressor) two hours prior to test. . HOLD Furosemide/Hydrochlorothiazide morning of the test.      After the Test: . Drink plenty of water. . After receiving IV contrast, you may experience a mild flushed feeling. This is normal. . On occasion, you may experience a mild rash up to 24 hours after the test. This is not dangerous. If this occurs, you can take Benadryl 25 mg and increase your fluid intake. . If you experience trouble breathing, this can be serious. If it is severe call 911 IMMEDIATELY. If it is mild, please call our office. . If you take any of these medications: Glipizide/Metformin, Avandament, Glucavance, please do not take 48 hours after completing test unless otherwise instructed.   Once we have confirmed authorization from your insurance company, we will call you to set up a date and time for your test. Based on how quickly your insurance processes prior authorizations requests, please allow up to 4 weeks to be contacted for scheduling your Cardiac CT appointment. Be advised that routine Cardiac CT appointments could be scheduled as many as 8 weeks after your provider has ordered it.  For non-scheduling related questions, please contact the cardiac imaging nurse navigator should you have any questions/concerns: Marchia Bond, Cardiac Imaging Nurse Navigator Burley Saver, Interim Cardiac Imaging Nurse [place] [facility] Direct Office Dial: [phone]   For scheduling needs, including cancellations and rescheduling, please call [place], [phone].     Electrophysiology/Ablation Procedure Instructions   You are scheduled for a(n)  ablation on ______________ with Dr. Allegra Lai.   1.   Pre procedure testing-             A.  LAB WORK --- On _____________  for your pre procedure blood work.  You do NOT need  to be fasting.               B. COVID TEST-- On ____________ @ __________ - This is a Drive Up Visit at [address] [place], [place] [address].  Someone will direct you to the appropriate testing line. Stay in your car and someone will be with you shortly.   After you are tested please go home and self quarantine until the day of your procedure.     PROCEDURE DAY: 2. On the day of your procedure ___________ you will go to [facility] hospital [id number]) at ____________.  You will go to the main entrance [address] [employer]) and enter where the [employer] parking staff are.  Your driver will drop you off and you will head down the hallway to ADMITTING.  You may have one support person come in to the hospital with you.  They will be asked to wait in the waiting room.  It is OK to have someone drop you off and come back when you are ready to be discharged.   3.   Do not eat or drink after midnight prior to your procedure.  4.   Do not miss any doses of your blood thinner prior to the morning of your procedure or your procedure will need to be rescheduled.       Do NOT take any medications the morning of your procedure.   5.  Plan for an overnight stay, but you may be discharged home after your procedure. If you use your phone frequently bring your phone charger, in case you have to stay.  If you are discharged after your procedure you will need someone to drive you home and be with your for 24 hours after your procedure.   6. You will follow up with the AFIB clinic 4 weeks after your procedure.  You will follow up with Dr. Curt Bears  3 months after your procedure.  These appointments will be made for you.   * If you have ANY questions please call the office [phone] and ask for Latrece Nitta RN or send me a MyChart message   * Occasionally, EP Studies and ablations can become lengthy.  Please make your family aware of this before your procedure starts.  Average time ranges from 2-8 hours for EP  studies/ablations.  Your physician will call your family after the procedure with the results.                                    Cardiac Ablation Cardiac ablation is a procedure to destroy (ablate) some heart tissue that is sending bad signals. These bad signals cause problems in heart rhythm. The heart has many areas that make these signals. If there are problems in these areas, they can make the heart beat in a way that is not normal. Destroying some tissues can help make the heart rhythm normal. Tell your doctor about:  Any allergies you have.  All medicines you are taking. These include vitamins, herbs, eye drops, creams, and over-the-counter medicines.  Any problems you or family members have had with medicines that make you fall asleep (anesthetics).  Any blood disorders you have.  Any surgeries you have had.  Any medical conditions you have, such as kidney failure.  Whether you are pregnant or may be pregnant. What are the risks? This is a safe procedure. But problems may occur, including:  Infection.  Bruising and bleeding.  Bleeding into the chest.  Stroke or blood clots.  Damage to nearby areas of your body.  Allergies to medicines or dyes.  The need for a pacemaker if the normal system is damaged.  Failure of the procedure to treat the problem. What happens before the procedure? Medicines Ask your doctor about:  Changing or stopping your normal medicines. This is important.  Taking aspirin and ibuprofen. Do not take these medicines unless your doctor tells you to take them.  Taking other medicines, vitamins, herbs, and supplements. General instructions  Follow instructions from your doctor about what you cannot eat or drink.  Plan to have someone take you home from the hospital or clinic.  If you will be going home right after the procedure, plan to have someone with you for 24 hours.  Ask your doctor what steps will be taken to prevent  infection. What happens during the procedure?  An IV tube will be put into one of your veins.  You will be given a medicine to help you relax.  The skin on your neck or groin will be numbed.  A cut (  incision) will be made in your neck or groin. A needle will be put through your cut and into a large vein.  A tube (catheter) will be put into the needle. The tube will be moved to your heart.  Dye may be put through the tube. This helps your doctor see your heart.  Small devices (electrodes) on the tube will send out signals.  A type of energy will be used to destroy some heart tissue.  The tube will be taken out.  Pressure will be held on your cut. This helps stop bleeding.  A bandage will be put over your cut. The exact procedure may vary among doctors and hospitals.   What happens after the procedure?  You will be watched until you leave the hospital or clinic. This includes checking your heart rate, breathing rate, oxygen, and blood pressure.  Your cut will be watched for bleeding. You will need to lie still for a few hours.  Do not drive for 24 hours or as long as your doctor tells you. Summary  Cardiac ablation is a procedure to destroy some heart tissue. This is done to treat heart rhythm problems.  Tell your doctor about any medical conditions you may have. Tell him or her about all medicines you are taking to treat them.  This is a safe procedure. But problems may occur. These include infection, bruising, bleeding, and damage to nearby areas of your body.  Follow what your doctor tells you about food and drink. You may also be told to change or stop some of your medicines.  After the procedure, do not drive for 24 hours or as long as your doctor tells you. This information is not intended to replace advice given to you by your health care provider. Make sure you discuss any questions you have with your health care provider. Document Revised: 06/14/2019 Document  Reviewed: 06/14/2019 Elsevier Patient Education  2021 [employer].

## 2020-09-01 ENCOUNTER — Telehealth: Payer: Self-pay | Admitting: Cardiology

## 2020-09-01 NOTE — Telephone Encounter (Signed)
    Estill Bamberg with [facility], she is requesting for pt's OV notes last 02/03 with Dr. Curt Bears. She gave fax# [phone]

## 2020-09-09 NOTE — Telephone Encounter (Signed)
Last office visit faxed to [facility] 09/09/20

## 2020-09-25 ENCOUNTER — Telehealth: Payer: Self-pay | Admitting: Cardiology

## 2020-09-25 DIAGNOSIS — I4819 Other persistent atrial fibrillation: Secondary | ICD-10-CM

## 2020-09-25 DIAGNOSIS — Z01812 Encounter for preprocedural laboratory examination: Secondary | ICD-10-CM

## 2020-09-25 NOTE — Telephone Encounter (Signed)
Patient states he was given availability dates to schedule his ablation. He states he would like to schedule it 11/19/2020.

## 2020-09-25 NOTE — Telephone Encounter (Signed)
Pt aware I will get arranged and be in touch at later date to review instructions, etc. Patient verbalized understanding and agreeable to plan.

## 2020-10-07 ENCOUNTER — Telehealth: Payer: Self-pay

## 2020-10-07 ENCOUNTER — Telehealth (INDEPENDENT_AMBULATORY_CARE_PROVIDER_SITE_OTHER): Payer: Self-pay | Admitting: Gastroenterology

## 2020-10-07 DIAGNOSIS — Z8659 Personal history of other mental and behavioral disorders: Secondary | ICD-10-CM | POA: Insufficient documentation

## 2020-10-07 DIAGNOSIS — J439 Emphysema, unspecified: Secondary | ICD-10-CM | POA: Insufficient documentation

## 2020-10-07 DIAGNOSIS — B353 Tinea pedis: Secondary | ICD-10-CM | POA: Insufficient documentation

## 2020-10-07 DIAGNOSIS — J9801 Acute bronchospasm: Secondary | ICD-10-CM | POA: Insufficient documentation

## 2020-10-07 DIAGNOSIS — I2510 Atherosclerotic heart disease of native coronary artery without angina pectoris: Secondary | ICD-10-CM | POA: Insufficient documentation

## 2020-10-07 DIAGNOSIS — M25512 Pain in left shoulder: Secondary | ICD-10-CM | POA: Insufficient documentation

## 2020-10-07 DIAGNOSIS — R0600 Dyspnea, unspecified: Secondary | ICD-10-CM | POA: Insufficient documentation

## 2020-10-07 DIAGNOSIS — F17201 Nicotine dependence, unspecified, in remission: Secondary | ICD-10-CM | POA: Insufficient documentation

## 2020-10-07 DIAGNOSIS — I4891 Unspecified atrial fibrillation: Secondary | ICD-10-CM | POA: Insufficient documentation

## 2020-10-07 DIAGNOSIS — Z789 Other specified health status: Secondary | ICD-10-CM | POA: Insufficient documentation

## 2020-10-07 DIAGNOSIS — E785 Hyperlipidemia, unspecified: Secondary | ICD-10-CM | POA: Insufficient documentation

## 2020-10-07 DIAGNOSIS — F40240 Claustrophobia: Secondary | ICD-10-CM | POA: Insufficient documentation

## 2020-10-07 DIAGNOSIS — N62 Hypertrophy of breast: Secondary | ICD-10-CM | POA: Insufficient documentation

## 2020-10-07 DIAGNOSIS — R0609 Other forms of dyspnea: Secondary | ICD-10-CM | POA: Insufficient documentation

## 2020-10-07 DIAGNOSIS — M109 Gout, unspecified: Secondary | ICD-10-CM | POA: Insufficient documentation

## 2020-10-07 DIAGNOSIS — M25511 Pain in right shoulder: Secondary | ICD-10-CM | POA: Insufficient documentation

## 2020-10-07 DIAGNOSIS — H40059 Ocular hypertension, unspecified eye: Secondary | ICD-10-CM | POA: Insufficient documentation

## 2020-10-07 DIAGNOSIS — F419 Anxiety disorder, unspecified: Secondary | ICD-10-CM | POA: Insufficient documentation

## 2020-10-07 DIAGNOSIS — G4730 Sleep apnea, unspecified: Secondary | ICD-10-CM | POA: Insufficient documentation

## 2020-10-07 DIAGNOSIS — Z86010 Personal history of colonic polyps: Secondary | ICD-10-CM

## 2020-10-07 NOTE — Telephone Encounter (Signed)
Patients call has been returned to get his requested date for colonoscopy.  LVM for him to call me back and leave a voice message to let me know what date he would like to schedule his colonoscopy to.  Thanks,  [place], [place]

## 2020-10-07 NOTE — Progress Notes (Signed)
Gastroenterology Pre-Procedure Review  Request Date: Not Scheduled Due to Cardiac Testing to be completed at the end of April.  Pt will call back after cardiac testing has been completed. Requesting Physician: Dr. Vicente Males  PATIENT REVIEW QUESTIONS: The patient responded to the following health history questions as indicated:    1. Are you having any GI issues? no 2. Do you have a personal history of Polyps? yes (12/18/19 polyps noted by Dr. Vicente Males) 3. Do you have a family history of Colon Cancer or Polyps? no 4. Diabetes Mellitus? yes (type 2) 5. Joint replacements in the past 12 months?no 6. Major health problems in the past 3 months?yes (ER Visit for Chest Pain 06/06/20 and 08/15/20) 7. Any artificial heart valves, MVP, or defibrillator?no    MEDICATIONS & ALLERGIES:    Patient reports the following regarding taking any anticoagulation/antiplatelet therapy:   Plavix, Coumadin, Eliquis, Xarelto, Lovenox, Pradaxa, Brilinta, or Effient?Eliquis prescribed by pts PCP [facility]  Fax # [phone], Office # [phone] Aspirin? no  Patient confirms/reports the following medications:  Current Outpatient Medications  Medication Sig Dispense Refill  . albuterol (PROVENTIL HFA;VENTOLIN HFA) 108 (90 BASE) MCG/ACT inhaler Inhale 2 puffs into the lungs every 6 (six) hours as needed for wheezing. 1 Inhaler 2  . allopurinol (ZYLOPRIM) 100 MG tablet Take 1 tablet by mouth daily.    Marland Kitchen apixaban (ELIQUIS) 5 MG TABS tablet Take 1 tablet by mouth 2 (two) times daily.    . carboxymethylcellulose (REFRESH PLUS) 0.5 % SOLN Place 1 drop into both eyes as needed.    . diltiazem (TIAZAC) 240 MG 24 hr capsule Take 1 capsule by mouth daily.    Marland Kitchen ibuprofen (ADVIL) 800 MG tablet Take 800 mg by mouth every 8 (eight) hours as needed for mild pain.     Marland Kitchen latanoprost (XALATAN) 0.005 % ophthalmic solution Place 1 drop into both eyes at bedtime.    . pravastatin (PRAVACHOL) 10 MG tablet Take 1 tablet by mouth at  bedtime.    Marland Kitchen aspirin 81 MG tablet Take 81 mg by mouth daily.  (Patient not taking: Reported on 10/07/2020)    . clotrimazole (LOTRIMIN) 1 % external solution Apply 1 application topically as needed. (Patient not taking: Reported on 10/07/2020)    . diclofenac (VOLTAREN) 75 MG EC tablet Take 75 mg by mouth 2 (two) times daily. (Patient not taking: Reported on 10/07/2020)    . famotidine (PEPCID) 40 MG tablet Take 1 tablet by mouth at bedtime. (Patient not taking: Reported on 10/07/2020)    . pantoprazole (PROTONIX) 40 MG tablet Take 40 mg by mouth daily. (Patient not taking: Reported on 10/07/2020)    . Tiotropium Bromide Monohydrate 1.25 MCG/ACT AERS Inhale 2 puffs into the lungs as needed. (Patient not taking: Reported on 10/07/2020)     No current facility-administered medications for this visit.    Patient confirms/reports the following allergies:  Allergies  Allergen Reactions  . Atorvastatin Other (See Comments)  . Flecainide Nausea Only and Other (See Comments)  . Lisinopril Cough    No orders of the defined types were placed in this encounter.   AUTHORIZATION INFORMATION Primary Insurance: 1D#: Group #:  Secondary Insurance: 1D#: Group #:  SCHEDULE INFORMATION: Date: Pt to call back to schedule Time: Location:[facility]

## 2020-10-08 ENCOUNTER — Other Ambulatory Visit: Payer: Self-pay

## 2020-10-08 DIAGNOSIS — Z86010 Personal history of colonic polyps: Secondary | ICD-10-CM

## 2020-10-08 MED ORDER — NA SULFATE-K SULFATE-MG SULF 17.5-3.13-1.6 GM/177ML PO SOLN: 1.0000 | Freq: Once | ORAL | 0 refills | Status: AC

## 2020-10-20 ENCOUNTER — Telehealth: Payer: Self-pay

## 2020-10-20 NOTE — Telephone Encounter (Signed)
Patients colonoscopy procedure will be postponed per Dr. Lestine Mount.  "Cardiac Clearance cannot be provided at this time due to patient needing ablation and cardiology consultant prior to clearance being given".  Eliquis holding advice will be determined after cardiac ablation.  Recommends reschedule scope until after cardiology treats and evaluates patient.  This has been placed upfront for scanning into patients chart.  Thanks,  [place], [place]

## 2020-10-21 ENCOUNTER — Other Ambulatory Visit: Admission: RE | Admit: 2020-10-21 | Payer: No Typology Code available for payment source | Source: Ambulatory Visit

## 2020-10-21 NOTE — Telephone Encounter (Signed)
Sounds good

## 2020-10-23 ENCOUNTER — Ambulatory Visit: Admit: 2020-10-23 | Payer: No Typology Code available for payment source | Admitting: Gastroenterology

## 2020-10-23 SURGERY — COLONOSCOPY WITH PROPOFOL
Anesthesia: General

## 2020-10-27 ENCOUNTER — Telehealth: Payer: Self-pay | Admitting: Gastroenterology

## 2020-10-27 NOTE — Telephone Encounter (Signed)
Can you call Kelli at the [facility] regarding this patient?

## 2020-10-27 NOTE — Telephone Encounter (Signed)
Please call Kelli with [facility] at [phone].  She needs Cardiac Clearance for this patient for his procedure.

## 2020-10-27 NOTE — Telephone Encounter (Signed)
Returned call back to [place] at the [place].  LVM to let her know that patient cannot have colonoscopy until cardiac testing has been completed. Per Dr. Blanchie Dessert cardiac request.  Thanks,  Sharyn Lull, [place]

## 2020-10-30 MED ORDER — METOPROLOL TARTRATE 100 MG PO TABS: ORAL_TABLET | ORAL | 0 refills | Status: DC

## 2020-10-30 NOTE — Addendum Note (Signed)
Addended by: Stanton Kidney on: 10/30/2020 06:33 PM   Modules accepted: Orders

## 2020-10-30 NOTE — Telephone Encounter (Addendum)
Reviewed CT &  procedure instructions w/ pt. Aware to take Lopressor 100 mg 2 hours prior to CT. Rx sent to pharmacy Pt will stop by office 4/13 for pre procedure labs. Covid testing scheduled for 4/25. Pt aware hospital/office will call to arrange CT and post procedure follow up. Patient verbalized understanding and agreeable to plan.

## 2020-11-05 ENCOUNTER — Other Ambulatory Visit: Payer: No Typology Code available for payment source

## 2020-11-06 ENCOUNTER — Other Ambulatory Visit: Payer: No Typology Code available for payment source | Admitting: *Deleted

## 2020-11-06 ENCOUNTER — Other Ambulatory Visit: Payer: Self-pay

## 2020-11-06 DIAGNOSIS — Z01812 Encounter for preprocedural laboratory examination: Secondary | ICD-10-CM

## 2020-11-06 DIAGNOSIS — I4819 Other persistent atrial fibrillation: Secondary | ICD-10-CM

## 2020-11-06 LAB — BASIC METABOLIC PANEL
BUN/Creatinine Ratio: 11 (ref 10–24)
BUN: 12 mg/dL (ref 8–27)
CO2: 22 mmol/L (ref 20–29)
Calcium: 9.3 mg/dL (ref 8.6–10.2)
Chloride: 102 mmol/L (ref 96–106)
Creatinine, Ser: 1.05 mg/dL (ref 0.76–1.27)
Glucose: 190 mg/dL — ABNORMAL HIGH (ref 65–99)
Potassium: 4.3 mmol/L (ref 3.5–5.2)
Sodium: 142 mmol/L (ref 134–144)
eGFR: 79 mL/min/{1.73_m2} (ref >59)

## 2020-11-06 LAB — CBC
Hematocrit: 45.2 % (ref 37.5–51.0)
Hemoglobin: 15.1 g/dL (ref 13.0–17.7)
MCH: 28.4 pg (ref 26.6–33.0)
MCHC: 33.4 g/dL (ref 31.5–35.7)
MCV: 85 fL (ref 79–97)
Platelets: 234 10*3/uL (ref 150–450)
RBC: 5.32 x10E6/uL (ref 4.14–5.80)
RDW: 13.3 % (ref 11.6–15.4)
WBC: 8.7 10*3/uL (ref 3.4–10.8)

## 2020-11-11 ENCOUNTER — Telehealth (HOSPITAL_COMMUNITY): Payer: Self-pay | Admitting: *Deleted

## 2020-11-11 NOTE — Telephone Encounter (Signed)
Reaching out to patient to offer assistance regarding upcoming cardiac imaging study; pt verbalizes understanding of appt date/time, parking situation and where to check in, pre-test NPO status and medications ordered, and verified current allergies; name and call back number provided for further questions should they arise  Chief [place] RN Navigator Cardiac Imaging [facility] [phone] office [phone] cell  

## 2020-11-13 ENCOUNTER — Telehealth: Payer: Self-pay | Admitting: Cardiology

## 2020-11-13 ENCOUNTER — Ambulatory Visit (HOSPITAL_COMMUNITY)
Admission: RE | Admit: 2020-11-13 | Discharge: 2020-11-13 | Disposition: A | Discharge status: Home / Self Care | Payer: No Typology Code available for payment source | Source: Ambulatory Visit | Attending: Cardiology | Admitting: Cardiology

## 2020-11-13 ENCOUNTER — Other Ambulatory Visit: Payer: Self-pay

## 2020-11-13 DIAGNOSIS — I4819 Other persistent atrial fibrillation: Secondary | ICD-10-CM | POA: Insufficient documentation

## 2020-11-13 MED ORDER — IOHEXOL 350 MG/ML SOLN
80.0000 mL | Freq: Once | INTRAVENOUS | Status: AC | PRN
  Administered 2020-11-13: 80 mL via INTRAVENOUS

## 2020-11-13 NOTE — Telephone Encounter (Signed)
[facility] called to talk with triage about the patient report

## 2020-11-13 NOTE — Telephone Encounter (Signed)
RN received call from [facility] asking to ensure report of CT was available in chart for Dr. Curt Bears to review impressions. RN assured that report was available and advised I would send him a message. Caller verbalized understanding.

## 2020-11-17 ENCOUNTER — Other Ambulatory Visit (HOSPITAL_COMMUNITY)
Admission: RE | Admit: 2020-11-17 | Discharge: 2020-11-17 | Disposition: A | Discharge status: Home / Self Care | Payer: No Typology Code available for payment source | Source: Ambulatory Visit | Attending: Cardiology | Admitting: Cardiology

## 2020-11-17 DIAGNOSIS — Z01812 Encounter for preprocedural laboratory examination: Secondary | ICD-10-CM | POA: Diagnosis present

## 2020-11-17 DIAGNOSIS — Z20822 Contact with and (suspected) exposure to covid-19: Secondary | ICD-10-CM | POA: Insufficient documentation

## 2020-11-18 LAB — SARS CORONAVIRUS 2 (TAT 6-24 HRS): SARS Coronavirus 2: NEGATIVE

## 2020-11-18 NOTE — Pre-Procedure Instructions (Signed)
Instructed patient on the following items: °Arrival time 0930 °Nothing to eat or drink after midnight °No meds AM of procedure °Responsible person to drive you home and stay with you for 24 hrs ° °Have you missed any doses of anti-coagulant Eliquis- hasn't missed any doses °   °

## 2020-11-19 ENCOUNTER — Other Ambulatory Visit: Payer: Self-pay

## 2020-11-19 ENCOUNTER — Encounter (HOSPITAL_COMMUNITY): Payer: Self-pay | Admitting: Cardiology

## 2020-11-19 ENCOUNTER — Ambulatory Visit (HOSPITAL_COMMUNITY)
Admission: RE | Admit: 2020-11-19 | Discharge: 2020-11-20 | Disposition: A | Discharge status: Home / Self Care | Payer: No Typology Code available for payment source | Source: Ambulatory Visit | Attending: Cardiology | Admitting: Cardiology

## 2020-11-19 ENCOUNTER — Ambulatory Visit (HOSPITAL_COMMUNITY): Payer: No Typology Code available for payment source | Admitting: Anesthesiology

## 2020-11-19 ENCOUNTER — Encounter (HOSPITAL_COMMUNITY)
Admission: RE | Disposition: A | Discharge status: Home / Self Care | Payer: Self-pay | Source: Ambulatory Visit | Attending: Cardiology

## 2020-11-19 DIAGNOSIS — Z87891 Personal history of nicotine dependence: Secondary | ICD-10-CM | POA: Insufficient documentation

## 2020-11-19 DIAGNOSIS — Z888 Allergy status to other drugs, medicaments and biological substances status: Secondary | ICD-10-CM | POA: Diagnosis not present

## 2020-11-19 DIAGNOSIS — G4730 Sleep apnea, unspecified: Secondary | ICD-10-CM | POA: Insufficient documentation

## 2020-11-19 DIAGNOSIS — I4819 Other persistent atrial fibrillation: Secondary | ICD-10-CM | POA: Insufficient documentation

## 2020-11-19 DIAGNOSIS — I10 Essential (primary) hypertension: Secondary | ICD-10-CM | POA: Insufficient documentation

## 2020-11-19 DIAGNOSIS — E119 Type 2 diabetes mellitus without complications: Secondary | ICD-10-CM | POA: Diagnosis not present

## 2020-11-19 HISTORY — PX: ATRIAL FIBRILLATION ABLATION: EP1191

## 2020-11-19 LAB — POCT ACTIVATED CLOTTING TIME
Activated Clotting Time: 267 seconds
Activated Clotting Time: 315 seconds

## 2020-11-19 LAB — GLUCOSE, CAPILLARY: Glucose-Capillary: 171 mg/dL — ABNORMAL HIGH (ref 70–99)

## 2020-11-19 SURGERY — ATRIAL FIBRILLATION ABLATION
Anesthesia: General

## 2020-11-19 MED ORDER — PHENYLEPHRINE HCL-NACL 10-0.9 MG/250ML-% IV SOLN
INTRAVENOUS | Status: DC | PRN
  Administered 2020-11-19: 75 ug/min via INTRAVENOUS

## 2020-11-19 MED ORDER — SODIUM CHLORIDE 0.9 % IV SOLN: INTRAVENOUS | Status: DC | PRN

## 2020-11-19 MED ORDER — APIXABAN 5 MG PO TABS
5.0000 mg | ORAL_TABLET | Freq: Two times a day (BID) | ORAL | Status: DC
  Filled 2020-11-19: qty 1

## 2020-11-19 MED ORDER — LATANOPROST 0.005 % OP SOLN
1.0000 [drp] | Freq: Every day | OPHTHALMIC | Status: DC
  Administered 2020-11-19: 1 [drp] via OPHTHALMIC
  Filled 2020-11-19: qty 2.5

## 2020-11-19 MED ORDER — PROTAMINE SULFATE 10 MG/ML IV SOLN
INTRAVENOUS | Status: DC | PRN
  Administered 2020-11-19: 20 mg via INTRAVENOUS
  Administered 2020-11-19 (×2): 10 mg via INTRAVENOUS

## 2020-11-19 MED ORDER — HEPARIN SODIUM (PORCINE) 1000 UNIT/ML IJ SOLN
INTRAMUSCULAR | Status: AC
  Filled 2020-11-19: qty 1

## 2020-11-19 MED ORDER — ACETAMINOPHEN 325 MG PO TABS
650.0000 mg | ORAL_TABLET | ORAL | Status: DC | PRN
  Administered 2020-11-19: 650 mg via ORAL

## 2020-11-19 MED ORDER — POLYVINYL ALCOHOL 1.4 % OP SOLN
1.0000 [drp] | Freq: Every day | OPHTHALMIC | Status: DC | PRN
  Filled 2020-11-19: qty 15

## 2020-11-19 MED ORDER — HEPARIN SODIUM (PORCINE) 1000 UNIT/ML IJ SOLN
INTRAMUSCULAR | Status: DC | PRN
  Administered 2020-11-19: 1000 [IU] via INTRAVENOUS

## 2020-11-19 MED ORDER — ACETAMINOPHEN 325 MG PO TABS
ORAL_TABLET | ORAL | Status: AC
  Filled 2020-11-19: qty 2

## 2020-11-19 MED ORDER — ALLOPURINOL 100 MG PO TABS
100.0000 mg | ORAL_TABLET | Freq: Every day | ORAL | Status: DC | PRN
  Filled 2020-11-19: qty 1

## 2020-11-19 MED ORDER — CINNAMON 500 MG PO CAPS: 500.0000 mg | ORAL_CAPSULE | Freq: Every day | ORAL | Status: DC

## 2020-11-19 MED ORDER — ACETAMINOPHEN 500 MG PO TABS
1000.0000 mg | ORAL_TABLET | Freq: Once | ORAL | Status: AC
  Administered 2020-11-19: 1000 mg via ORAL
  Filled 2020-11-19: qty 2

## 2020-11-19 MED ORDER — SODIUM CHLORIDE 0.9 % IV SOLN: INTRAVENOUS | Status: DC

## 2020-11-19 MED ORDER — PRAVASTATIN SODIUM 10 MG PO TABS
10.0000 mg | ORAL_TABLET | Freq: Every day | ORAL | Status: DC
  Administered 2020-11-19: 10 mg via ORAL
  Filled 2020-11-19 (×2): qty 1

## 2020-11-19 MED ORDER — LACTATED RINGERS IV SOLN: INTRAVENOUS | Status: DC | PRN

## 2020-11-19 MED ORDER — SUGAMMADEX SODIUM 200 MG/2ML IV SOLN
INTRAVENOUS | Status: DC | PRN
  Administered 2020-11-19: 200 mg via INTRAVENOUS

## 2020-11-19 MED ORDER — GLYCOPYRROLATE PF 0.2 MG/ML IJ SOSY
PREFILLED_SYRINGE | INTRAMUSCULAR | Status: DC | PRN
  Administered 2020-11-19: .2 mg via INTRAVENOUS

## 2020-11-19 MED ORDER — ALBUTEROL SULFATE HFA 108 (90 BASE) MCG/ACT IN AERS
2.0000 | INHALATION_SPRAY | Freq: Four times a day (QID) | RESPIRATORY_TRACT | Status: DC | PRN
  Filled 2020-11-19: qty 6.7

## 2020-11-19 MED ORDER — ONDANSETRON HCL 4 MG/2ML IJ SOLN
INTRAMUSCULAR | Status: DC | PRN
  Administered 2020-11-19: 4 mg via INTRAVENOUS

## 2020-11-19 MED ORDER — DOBUTAMINE IN D5W 4-5 MG/ML-% IV SOLN
INTRAVENOUS | Status: DC | PRN
  Administered 2020-11-19: 20 ug/kg/min via INTRAVENOUS

## 2020-11-19 MED ORDER — SODIUM CHLORIDE 0.9% FLUSH
3.0000 mL | Freq: Two times a day (BID) | INTRAVENOUS | Status: DC
  Administered 2020-11-19: 3 mL via INTRAVENOUS

## 2020-11-19 MED ORDER — HEPARIN SODIUM (PORCINE) 1000 UNIT/ML IJ SOLN
INTRAMUSCULAR | Status: DC | PRN
  Administered 2020-11-19: 3000 [IU] via INTRAVENOUS
  Administered 2020-11-19: 15000 [IU] via INTRAVENOUS
  Administered 2020-11-19: 6000 [IU] via INTRAVENOUS

## 2020-11-19 MED ORDER — LIDOCAINE 2% (20 MG/ML) 5 ML SYRINGE
INTRAMUSCULAR | Status: DC | PRN
  Administered 2020-11-19: 100 mg via INTRAVENOUS

## 2020-11-19 MED ORDER — PROPOFOL 10 MG/ML IV BOLUS
INTRAVENOUS | Status: DC | PRN
  Administered 2020-11-19: 150 mg via INTRAVENOUS

## 2020-11-19 MED ORDER — DEXAMETHASONE SODIUM PHOSPHATE 10 MG/ML IJ SOLN
INTRAMUSCULAR | Status: DC | PRN
  Administered 2020-11-19: 10 mg via INTRAVENOUS

## 2020-11-19 MED ORDER — HEPARIN (PORCINE) IN NACL 1000-0.9 UT/500ML-% IV SOLN
INTRAVENOUS | Status: AC
  Filled 2020-11-19: qty 2500

## 2020-11-19 MED ORDER — DILTIAZEM HCL ER COATED BEADS 240 MG PO CP24
240.0000 mg | ORAL_CAPSULE | Freq: Every day | ORAL | Status: DC
  Administered 2020-11-19 – 2020-11-20 (×2): 240 mg via ORAL
  Filled 2020-11-19 (×4): qty 1

## 2020-11-19 MED ORDER — HEPARIN (PORCINE) IN NACL 1000-0.9 UT/500ML-% IV SOLN
INTRAVENOUS | Status: DC | PRN
  Administered 2020-11-19 (×5): 500 mL

## 2020-11-19 MED ORDER — SODIUM CHLORIDE 0.9% FLUSH: 3.0000 mL | INTRAVENOUS | Status: DC | PRN

## 2020-11-19 MED ORDER — ADULT MULTIVITAMIN W/MINERALS CH
1.0000 | ORAL_TABLET | Freq: Every day | ORAL | Status: DC
  Administered 2020-11-19 – 2020-11-20 (×2): 1 via ORAL
  Filled 2020-11-19 (×2): qty 1

## 2020-11-19 MED ORDER — ROCURONIUM BROMIDE 10 MG/ML (PF) SYRINGE
PREFILLED_SYRINGE | INTRAVENOUS | Status: DC | PRN
  Administered 2020-11-19: 80 mg via INTRAVENOUS

## 2020-11-19 MED ORDER — SODIUM CHLORIDE 0.9 % IV SOLN: 250.0000 mL | INTRAVENOUS | Status: DC | PRN

## 2020-11-19 MED ORDER — VITAMIN D 25 MCG (1000 UNIT) PO TABS
1000.0000 [IU] | ORAL_TABLET | Freq: Every day | ORAL | Status: DC
  Administered 2020-11-19 – 2020-11-20 (×2): 1000 [IU] via ORAL
  Filled 2020-11-19 (×2): qty 1

## 2020-11-19 MED ORDER — DOBUTAMINE IN D5W 4-5 MG/ML-% IV SOLN
INTRAVENOUS | Status: AC
  Filled 2020-11-19: qty 250

## 2020-11-19 MED ORDER — FENTANYL CITRATE (PF) 100 MCG/2ML IJ SOLN
INTRAMUSCULAR | Status: DC | PRN
  Administered 2020-11-19: 75 ug via INTRAVENOUS
  Administered 2020-11-19: 25 ug via INTRAVENOUS

## 2020-11-19 MED ORDER — ONDANSETRON HCL 4 MG/2ML IJ SOLN: 4.0000 mg | Freq: Four times a day (QID) | INTRAMUSCULAR | Status: DC | PRN

## 2020-11-19 MED ORDER — MIDAZOLAM HCL 5 MG/5ML IJ SOLN
INTRAMUSCULAR | Status: DC | PRN
  Administered 2020-11-19: 2 mg via INTRAVENOUS

## 2020-11-19 SURGICAL SUPPLY — 22 items
BAG SNAP BAND KOVER 36X36 (MISCELLANEOUS) ×2 IMPLANT
BLANKET WARM UNDERBOD FULL ACC (MISCELLANEOUS) ×2 IMPLANT
CATH MAPPNG PENTARAY F 2-6-2MM (CATHETERS) ×1 IMPLANT
CATH S CIRCA THERM PROBE 10F (CATHETERS) ×2 IMPLANT
CATH SMTCH THERMOCOOL SF DF (CATHETERS) ×2 IMPLANT
CATH SOUNDSTAR ECO 8FR (CATHETERS) ×2 IMPLANT
CATH WEB BI DIR CSDF CRV REPRO (CATHETERS) ×2 IMPLANT
CLOSURE PERCLOSE PROSTYLE (VASCULAR PRODUCTS) ×8 IMPLANT
COVER SWIFTLINK CONNECTOR (BAG) ×2 IMPLANT
KIT VERSACROSS STEERABLE D1 (CATHETERS) ×2 IMPLANT
MAT PREVALON FULL STRYKER (MISCELLANEOUS) ×2 IMPLANT
PACK EP LATEX FREE (CUSTOM PROCEDURE TRAY) ×2
PACK EP LF (CUSTOM PROCEDURE TRAY) ×1 IMPLANT
PAD PRO RADIOLUCENT 2001M-C (PAD) ×2 IMPLANT
PATCH CARTO3 (PAD) ×2 IMPLANT
PENTARAY F 2-6-2MM (CATHETERS) ×2
SHEATH CARTO VIZIGO SM CVD (SHEATH) ×2 IMPLANT
SHEATH PINNACLE 7F 10CM (SHEATH) ×2 IMPLANT
SHEATH PINNACLE 8F 10CM (SHEATH) ×4 IMPLANT
SHEATH PINNACLE 9F 10CM (SHEATH) ×2 IMPLANT
SHEATH PROBE COVER 6X72 (BAG) ×2 IMPLANT
TUBING SMART ABLATE COOLFLOW (TUBING) ×2 IMPLANT

## 2020-11-19 NOTE — H&P (Signed)
Electrophysiology Office Note   Date:  11/19/2020   ID:  Steven Powers [birth year], MRN [id number]  PCP:  [name]  Cardiologist: Shanda Howells Primary Electrophysiologist:   Dilyn Osoria [name], MD    Chief Complaint: Atrial fibrillation   History of Present Illness: Steven Powers is a 65 y.o. male who is being seen today for the evaluation of atrial fibrillation at the request of No ref. provider found. Presenting today for electrophysiology evaluation.  He has a history significant for diabetes, hypertension, sleep apnea, and atrial fibrillation.  He also has a history of atrial flutter status post ablation in 2012 at the [facility].  His atrial fibrillation was diagnosed June 2021 when he went to the emergency room to be evaluated for back pain and weakness.  Echocardiogram showed an EF of 50 to 55%, mildly dilated atria.  He was started on flecainide and had a successful cardioversion 06/03/2020.  His AF symptoms include palpitations and dyspnea on exertion.  He has no chest pain.  Unfortunately he has developed headaches and insomnia since starting flecainide.  Today, denies symptoms of palpitations, chest pain, shortness of breath, orthopnea, PND, lower extremity edema, claudication, dizziness, presyncope, syncope, bleeding, or neurologic sequela. The patient is tolerating medications without difficulties. Plan ablation today.    Past Medical History:  Diagnosis Date  . Arthritis   . Diabetes mellitus without complication ([place])   . Dysrhythmia    a fib with ablation "a couple of years ago"  . Hypertension   . Irregular heart beat   . Sleep apnea   . SOB (shortness of breath)    Past Surgical History:  Procedure Laterality Date  . COLONOSCOPY WITH PROPOFOL N/A 12/18/2019   Procedure: COLONOSCOPY WITH PROPOFOL;  Surgeon: Jonathon Bellows, MD;  Location: [facility] ENDOSCOPY;  Service: Gastroenterology;  Laterality: N/A;  . shoulder surgery left       No  current facility-administered medications for this encounter.    Allergies:   Atorvastatin, Flecainide, and Lisinopril   Social History:  The patient  reports that he quit smoking about 4 years ago. His smoking use included cigarettes. He smoked 0.50 packs per day. He has never used smokeless tobacco. He reports that he does not drink alcohol and does not use drugs.   Family History:  The patient's family history includes Diabetes in his brother.   ROS:  Please see the history of present illness.   Otherwise, review of systems is positive for none.   All other systems are reviewed and negative.   PHYSICAL EXAM: VS:  BP (!) 158/93   Pulse 76   Temp 98.3 F (36.8 C) (Oral)   Ht 6\' 1"  (1.854 m)   Wt 132.9 kg   SpO2 96%   BMI 38.66 kg/m  , BMI Body mass index is 38.66 kg/m. GEN: Well nourished, well developed, in no acute distress  HEENT: normal  Neck: no JVD, carotid bruits, or masses Cardiac: RRR; no murmurs, rubs, or gallops,no edema  Respiratory:  clear to auscultation bilaterally, normal work of breathing GI: soft, nontender, nondistended, + BS MS: no deformity or atrophy  Skin: warm and dry Neuro:  Strength and sensation are intact Psych: euthymic mood, full affect    Recent Labs: 11/06/2020: BUN 12; Creatinine, Ser 1.05; Hemoglobin 15.1; Platelets 234; Potassium 4.3; Sodium 142    Lipid Panel  No results found for: CHOL, TRIG, HDL, CHOLHDL, VLDL, LDLCALC, LDLDIRECT   Wt Readings from Last 3 Encounters:  11/19/20 132.9 kg  08/28/20 133.5 kg  12/18/19 131.5 kg      Other studies Reviewed: Additional studies/ records that were reviewed today include: TTE July 2021 Review of the above records today demonstrates:  Mildly dilated right atrium                                       Ejection fraction 50 to 55%    ASSESSMENT AND PLAN:  1.  Persistent atrial fibrillation: Steven Powers has presented today for surgery, with the diagnosis of AF.  The various  methods of treatment have been discussed with the patient and family. After consideration of risks, benefits and other options for treatment, the patient has consented to  Procedure(s): Catheter ablation as a surgical intervention .  Risks include but not limited to complete heart block, stroke, esophageal damage, nerve damage, bleeding, vascular damage, tamponade, perforation, MI, and death. The patient's history has been reviewed, patient examined, no change in status, stable for surgery.  I have reviewed the patient's chart and labs.  Questions were answered to the patient's satisfaction.    Amiaya Mcneeley Curt Bears, MD 11/19/2020 10:38 AM

## 2020-11-19 NOTE — Transfer of Care (Signed)
Immediate Anesthesia Transfer of Care Note  Patient: Steven Powers  Procedure(s) Performed: ATRIAL FIBRILLATION ABLATION (N/A )  Patient Location: Cath Lab  Anesthesia Type:General  Level of Consciousness: awake, alert  and oriented  Airway & Oxygen Therapy: Patient Spontanous Breathing and Patient connected to face mask oxygen  Post-op Assessment: Report given to RN, Post -op Vital signs reviewed and stable and Patient moving all extremities X 4  Post vital signs: Reviewed and stable  Last Vitals:  Vitals Value Taken Time  BP 134/64 11/19/20 1440  Temp 36.4 C 11/19/20 1430  Pulse 77 11/19/20 1447  Resp 19 11/19/20 1447  SpO2 90 % 11/19/20 1447  Vitals shown include unvalidated device data.  Last Pain:  Vitals:   11/19/20 0924  TempSrc: Oral         Complications: No complications documented.

## 2020-11-19 NOTE — Progress Notes (Signed)
PHARMACIST - PHYSICIAN ORDER COMMUNICATION  CONCERNING: P&T Medication Policy on Herbal Medications  DESCRIPTION:  This patient's order for:  Cinnamon  has been noted.  This product(s) is classified as an "herbal" or natural product. Due to a lack of definitive safety studies or FDA approval, nonstandard manufacturing practices, plus the potential risk of unknown drug-drug interactions while on inpatient medications, the Pharmacy and Therapeutics Committee does not permit the use of "herbal" or natural products of this type within [facility].   ACTION TAKEN: The pharmacy department is unable to verify this order at this time and your patient has been informed of this safety policy. Please reevaluate patient's clinical condition at discharge and address if the herbal or natural product(s) should be resumed at that time.  

## 2020-11-19 NOTE — Discharge Instructions (Signed)
Post procedure care instructions No driving for 4 days. No lifting over 5 lbs for 1 week. No vigorous or sexual activity for 1 week. You may return to work/your usual activities on 11/26/20. Keep procedure site clean & dry. If you notice increased pain, swelling, bleeding or pus, call/return!  You may shower after 24 hours, but no soaking in baths/hot tubs/pools for 1 week.    You have an appointment set up with the [facility].  Multiple studies have shown that being followed by a dedicated atrial fibrillation clinic in addition to the standard care you receive from your other physicians improves health. We believe that enrollment in the atrial fibrillation clinic will allow [place] to better care for you.   The phone number to the [facility] is [phone]. The clinic is staffed Monday through Friday from 8:30am to 5pm.  Parking Directions: The clinic is located in the Heart and Vascular Building connected to [facility] 1)From [address] turn on to [address] and go to the 3rd entrance  (Heart and Vascular entrance) on the right. 2)Look to the right for Heart &Vascular Parking Garage. 3)A code for the entrance is required, for May is 3211   4)Take the elevators to the 1st floor. Registration is in the room with the glass walls at the end of the hallway.  If you have any trouble parking or locating the clinic, please don't hesitate to call [phone].

## 2020-11-19 NOTE — Anesthesia Postprocedure Evaluation (Signed)
Anesthesia Post Note  Patient: Steven Powers  Procedure(s) Performed: ATRIAL FIBRILLATION ABLATION (N/A )     Patient location during evaluation: PACU Anesthesia Type: General Level of consciousness: awake and alert Pain management: pain level controlled Vital Signs Assessment: post-procedure vital signs reviewed and stable Respiratory status: spontaneous breathing, nonlabored ventilation, respiratory function stable and patient connected to nasal cannula oxygen Cardiovascular status: blood pressure returned to baseline and stable Postop Assessment: no apparent nausea or vomiting Anesthetic complications: no   No complications documented.  Last Vitals:  Vitals:   11/19/20 1525 11/19/20 1540  BP: 132/75 139/78  Pulse: 81 75  Resp: 18 (!) 24  Temp:    SpO2: 92% 92%    Last Pain:  Vitals:   11/19/20 0924  TempSrc: [name]

## 2020-11-19 NOTE — Progress Notes (Signed)
Additional manual pressure held for 40 mins.  Groin soft but bruised.

## 2020-11-19 NOTE — Anesthesia Preprocedure Evaluation (Signed)
Anesthesia Evaluation  Patient identified by MRN, date of birth, ID band Patient awake    Reviewed: Allergy & Precautions, NPO status , Patient's Chart, lab work & pertinent test results  Airway Mallampati: II  TM Distance: >3 FB Neck ROM: Full    Dental  (+) Dental Advisory Given   Pulmonary sleep apnea , COPD, former smoker,    breath sounds clear to auscultation       Cardiovascular hypertension, + CAD  + dysrhythmias Atrial Fibrillation  Rhythm:Regular Rate:Normal     Neuro/Psych  Neuromuscular disease    GI/Hepatic negative GI ROS, Neg liver ROS,   Endo/Other  diabetes, Type 2  Renal/GU negative Renal ROS     Musculoskeletal   Abdominal   Peds  Hematology negative hematology ROS (+)   Anesthesia Other Findings   Reproductive/Obstetrics                             Anesthesia Physical Anesthesia Plan  ASA: II  Anesthesia Plan: General   Post-op Pain Management:    Induction: Intravenous  PONV Risk Score and Plan: 2 and Dexamethasone, Ondansetron and Treatment may vary due to age or medical condition  Airway Management Planned: Oral ETT  Additional Equipment: None  Intra-op Plan:   Post-operative Plan: Extubation in OR  Informed Consent: I have reviewed the patients History and Physical, chart, labs and discussed the procedure including the risks, benefits and alternatives for the proposed anesthesia with the patient or authorized representative who has indicated his/her understanding and acceptance.     Dental advisory given  Plan Discussed with: CRNA  Anesthesia Plan Comments:         Anesthesia Quick Evaluation

## 2020-11-19 NOTE — Anesthesia Procedure Notes (Signed)
Procedure Name: Intubation Date/Time: 11/19/2020 11:39 AM Performed by: Mariea Clonts, CRNA Pre-anesthesia Checklist: Patient identified, Emergency Drugs available, Suction available and Patient being monitored Patient Re-evaluated:Patient Re-evaluated prior to induction Oxygen Delivery Method: Circle System Utilized Preoxygenation: Pre-oxygenation with 100% oxygen Induction Type: IV induction Ventilation: Oral airway inserted - appropriate to patient size and Two handed mask ventilation required Laryngoscope Size: Glidescope and 4 Grade View: Grade I Tube type: Oral Tube size: 8.0 mm Number of attempts: 1 Airway Equipment and Method: Stylet and Oral airway Placement Confirmation: ETT inserted through vocal cords under direct vision,  positive ETCO2 and breath sounds checked- equal and bilateral Tube secured with: Tape Dental Injury: Teeth and Oropharynx as per pre-operative assessment

## 2020-11-20 ENCOUNTER — Encounter (HOSPITAL_COMMUNITY): Payer: Self-pay | Admitting: Cardiology

## 2020-11-20 ENCOUNTER — Ambulatory Visit (HOSPITAL_BASED_OUTPATIENT_CLINIC_OR_DEPARTMENT_OTHER): Payer: No Typology Code available for payment source

## 2020-11-20 DIAGNOSIS — I4819 Other persistent atrial fibrillation: Secondary | ICD-10-CM | POA: Diagnosis not present

## 2020-11-20 DIAGNOSIS — I724 Aneurysm of artery of lower extremity: Secondary | ICD-10-CM

## 2020-11-20 DIAGNOSIS — Z87891 Personal history of nicotine dependence: Secondary | ICD-10-CM | POA: Diagnosis not present

## 2020-11-20 DIAGNOSIS — Z888 Allergy status to other drugs, medicaments and biological substances status: Secondary | ICD-10-CM | POA: Diagnosis not present

## 2020-11-20 DIAGNOSIS — E119 Type 2 diabetes mellitus without complications: Secondary | ICD-10-CM | POA: Diagnosis not present

## 2020-11-20 NOTE — Progress Notes (Signed)
Lower extremity arterial pseudoaneurysm check LT study completed.  Preliminary results relayed to [facility], RN.   See CV Proc for preliminary results report.   Darlin Coco, RDMS, RVT

## 2020-11-20 NOTE — Discharge Summary (Addendum)
ELECTROPHYSIOLOGY PROCEDURE DISCHARGE SUMMARY    Patient ID: Steven Powers,  MRN: [id number], DOB/AGE: [birth year] 65 y.o.  Admit date: 11/19/2020 Discharge date: 11/20/2020  Primary Care Physician: [name]  Primary Cardiologist: [facility] Electrophysiologist: Dr. Curt Bears Primary Discharge Diagnosis:  1.  Persistent Atrial fibrillation      CHA2DS2Vasc is 2, on Eliquis  Secondary Discharge Diagnosis:  1. DM 2. HTN 3. OSA  Procedures This Admission:  1.  Electrophysiology study and radiofrequency catheter ablation on 11/19/20 by Dr Curt Bears   This study demonstrated  CONCLUSIONS: 1. Atrial fibrillation upon presentation.   2. Successful electrical isolation and anatomical encircling of all four pulmonary veins with radiofrequency current.  A WACA approach was used 3. Additional left atrial ablation was performed with a standard box lesion created along the posterior wall of the left atrium 4. Atrial fibrillation successfully cardioverted to sinus rhythm. 5. No early apparent complications.   Brief HPI: Steven Powers is a 65 y.o. male with a history of AFlutter ablated remotely, developed persistent atrial fibrillation.  They have failed medical therapy with flecainide. Risks, benefits, and alternatives to catheter ablation of atrial fibrillation were reviewed with the patient who wished to proceed.    Hospital Course:  The patient was admitted post procedure 2/2 persistent oozing at left groin site and development of a small hematoma.   He monitored on telemetry overnight which demonstrated SR.  Groin is soft, no ongoing bleeding, vascular US done this morning was negative by prelim repirt for pseudoaneurysm.  The patientfeels well today, denies CP, SOB, site discomfort.   He was examined by Dr. Curt Bears and considered to be stable for discharge.  Wound care and restrictions were reviewed with the patient.  The patient Karstyn Birkey be seen back by the Afib  clinic in 4 weeks and Dr Curt Bears in 12 weeks for post ablation follow up.   Overnight desats though without his CPAP here, he also mentions he has been told he has COPD. RA sats are 94 awake   Physical Exam: Vitals:   11/19/20 1850 11/19/20 2004 11/20/20 0035 11/20/20 0517  BP: (!) 150/89 (!) 136/91 131/74 121/84  Pulse: 86 90 75 77  Resp: (!) 23 16 20 15   Temp:  98.3 F (36.8 C) 98.3 F (36.8 C) 97.6 F (36.4 C)  TempSrc:  Oral Oral   SpO2: 95% 92% 90% 94%  Weight:    135.2 kg  Height:        GEN- The patient is well appearing, alert and oriented x 3 today.   HEENT: normocephalic, atraumatic; sclera clear, conjunctiva pink; hearing intact; oropharynx clear; neck supple  Lungs- CTA b/l, normal work of breathing.  No wheezes, rales, rhonchi Heart- RRR, no murmurs, rubs or gallops  GI- soft, non-tender, non-distended, bowel sounds present  Extremities- no clubbing, cyanosis, or edema; DP/PT/radial pulses 2+ bilaterally, R groin is  groin without hematoma/bruit, left groin is soft, nontender, small hematoma noted MS- no significant deformity or atrophy Skin- warm and dry, no rash or lesion Psych- euthymic mood, full affect Neuro- strength and sensation are intact   Labs:   Lab Results  Component Value Date   WBC 8.7 11/06/2020   HGB 15.1 11/06/2020   HCT 45.2 11/06/2020   MCV 85 11/06/2020   PLT 234 11/06/2020   No results for input(s): NA, K, CL, CO2, BUN, CREATININE, CALCIUM, PROT, BILITOT, ALKPHOS, ALT, AST, GLUCOSE in the last 168 hours.  Invalid input(s): LABALBU  Discharge Medications:  Allergies as of 11/20/2020      Reactions   Atorvastatin Other (See Comments)   Made pt sick    Flecainide Nausea Only, Other (See Comments)   Lisinopril Cough      Medication List    TAKE these medications   albuterol 108 (90 Base) MCG/ACT inhaler Commonly known as: VENTOLIN HFA Inhale 2 puffs into the lungs every 6 (six) hours as needed for wheezing.   allopurinol  100 MG tablet Commonly known as: ZYLOPRIM Take 100 mg by mouth daily as needed (gout).   apixaban 5 MG Tabs tablet Commonly known as: ELIQUIS Take 5 mg by mouth 2 (two) times daily. Notes to patient: Do not resume until tomorrow morning 11/21/20   carboxymethylcellulose 0.5 % Soln Commonly known as: REFRESH PLUS Place 1 drop into both eyes daily as needed (dry eyes).   cholecalciferol 25 MCG (1000 UNIT) tablet Commonly known as: VITAMIN D3 Take 1,000 Units by mouth daily.   Cinnamon 500 MG capsule Take 500 mg by mouth daily.   diltiazem 240 MG 24 hr capsule Commonly known as: TIAZAC Take 240 mg by mouth daily.   latanoprost 0.005 % ophthalmic solution Commonly known as: XALATAN Place 1 drop into both eyes at bedtime.   metoprolol tartrate 100 MG tablet Commonly known as: LOPRESSOR Take 1 tablet (100 mg total) two hours prior to CT   multivitamin with minerals tablet Take 1 tablet by mouth daily.   pravastatin 10 MG tablet Commonly known as: PRAVACHOL Take 10 mg by mouth at bedtime.       Disposition: home  Discharge Instructions    Diet - low sodium heart healthy   Complete by: As directed    Increase activity slowly   Complete by: As directed       Follow-up Information    [facility] Follow up.   Specialty: Cardiology Why: 12/17/20 @ 11;30AM with R. Marlene Lard, PA-C Contact information: [address] [phone] mc [address] [place] [address] [phone]       Constance Haw, MD Follow up.   Specialty: Cardiology Why: 02/17/21 @ 2:00PM Contact information: [place] [id number] [phone]               Duration of Discharge Encounter: Greater than 30 minutes including physician time.  Venetia Night, PA-C 11/20/2020 9:51 AM   I have seen and examined this patient with Tommye Standard.  Agree with above, note added to reflect my findings.  On exam, RRR, no murmurs.    Patient is status post atrial fibrillation ablation yesterday.  Unfortunately he developed left groin hematoma.  Ultrasound with hematoma and no pseudoaneurysm.  Plan for discharge today with follow-up in clinic.  Jelisha Weed M. Jimmey Hengel MD 11/20/2020 9:57 AM

## 2020-12-02 ENCOUNTER — Telehealth: Payer: Self-pay | Admitting: Cardiology

## 2020-12-02 NOTE — Telephone Encounter (Signed)
Follow Up:     Pt is returning Steven Powers's call from yseterday.

## 2020-12-02 NOTE — Telephone Encounter (Signed)
See CT result for further documentation on this

## 2020-12-17 ENCOUNTER — Other Ambulatory Visit: Payer: Self-pay

## 2020-12-17 ENCOUNTER — Ambulatory Visit (HOSPITAL_COMMUNITY)
Admission: RE | Admit: 2020-12-17 | Discharge: 2020-12-17 | Disposition: A | Discharge status: Home / Self Care | Payer: No Typology Code available for payment source | Source: Ambulatory Visit | Attending: Physician Assistant | Admitting: Physician Assistant

## 2020-12-17 VITALS — BP 158/76 | HR 73 | Ht 73.0 in | Wt 294.4 lb

## 2020-12-17 DIAGNOSIS — G4733 Obstructive sleep apnea (adult) (pediatric): Secondary | ICD-10-CM | POA: Insufficient documentation

## 2020-12-17 DIAGNOSIS — Z79899 Other long term (current) drug therapy: Secondary | ICD-10-CM | POA: Insufficient documentation

## 2020-12-17 DIAGNOSIS — Z87891 Personal history of nicotine dependence: Secondary | ICD-10-CM | POA: Insufficient documentation

## 2020-12-17 DIAGNOSIS — I10 Essential (primary) hypertension: Secondary | ICD-10-CM | POA: Insufficient documentation

## 2020-12-17 DIAGNOSIS — Z6838 Body mass index (BMI) 38.0-38.9, adult: Secondary | ICD-10-CM | POA: Diagnosis not present

## 2020-12-17 DIAGNOSIS — D6869 Other thrombophilia: Secondary | ICD-10-CM | POA: Diagnosis not present

## 2020-12-17 DIAGNOSIS — E669 Obesity, unspecified: Secondary | ICD-10-CM | POA: Insufficient documentation

## 2020-12-17 DIAGNOSIS — I4892 Unspecified atrial flutter: Secondary | ICD-10-CM | POA: Diagnosis not present

## 2020-12-17 DIAGNOSIS — I4819 Other persistent atrial fibrillation: Secondary | ICD-10-CM | POA: Diagnosis not present

## 2020-12-17 DIAGNOSIS — Z7901 Long term (current) use of anticoagulants: Secondary | ICD-10-CM | POA: Diagnosis not present

## 2020-12-17 NOTE — Progress Notes (Signed)
Primary Care Physician: [name] Primary Cardiologist: Dr Olegario Shearer Primary Electrophysiologist: Dr Curt Bears Referring Physician: Dr Tor Netters is a 65 y.o. male with a history of DM, HTN, OSA, atrial flutter, atrial fibrillation who presents for consultation in the [facility]. Patient is on Eliquis for a CHADS2VASC score of 2. He is status post flutter ablation in 2012 at the [facility].  His atrial fibrillation was diagnosed June 2021 when he went to the emergency room to be evaluated for back pain and weakness. He was started on flecainide and had a successful cardioversion 06/03/2020.  His AF symptoms include palpitations and dyspnea on exertion. Unfortunately he has developed headaches and insomnia since starting flecainide.   On follow up today, patient is s/p afib ablation with Dr Curt Bears on 11/20/20. He reports that his strength and stamina are slowly returning. He denies CP, swallowing pain, or groin issues. He denies any bleeding issues on anticoagulation.   Today, he denies symptoms of palpitations, chest pain, shortness of breath, orthopnea, PND, lower extremity edema, dizziness, presyncope, syncope, bleeding, or neurologic sequela. The patient is tolerating medications without difficulties and is otherwise without complaint today.    Atrial Fibrillation Risk Factors:  he does have symptoms or diagnosis of sleep apnea. he is compliant with CPAP therapy. he does not have a history of rheumatic fever.   he has a BMI of Body mass index is 38.84 kg/m.Marland Kitchen Filed Weights   12/17/20 1115  Weight: 133.5 kg    Family History  Problem Relation Age of Onset  . Diabetes Brother      Atrial Fibrillation Management history:  Previous antiarrhythmic drugs: flecainide  Previous cardioversions: 06/03/20 Previous ablations: 2012 flutter, 11/20/20 CHADS2VASC score: 2 Anticoagulation history: Eliquis   Past Medical History:   Diagnosis Date  . Arthritis   . Diabetes mellitus without complication ([place])   . Dysrhythmia    a fib with ablation "a couple of years ago"  . Hypertension   . Irregular heart beat   . Sleep apnea   . SOB (shortness of breath)    Past Surgical History:  Procedure Laterality Date  . ATRIAL FIBRILLATION ABLATION N/A 11/19/2020   Procedure: ATRIAL FIBRILLATION ABLATION;  Surgeon: Constance Haw, MD;  Location: [facility];  Service: Cardiovascular;  Laterality: N/A;  . COLONOSCOPY WITH PROPOFOL N/A 12/18/2019   Procedure: COLONOSCOPY WITH PROPOFOL;  Surgeon: Jonathon Bellows, MD;  [facility] ENDOSCOPY;  Service: Gastroenterology;  Laterality: N/A;  . shoulder surgery left      Current Outpatient Medications  Medication Sig Dispense Refill  . albuterol (PROVENTIL HFA;VENTOLIN HFA) 108 (90 BASE) MCG/ACT inhaler Inhale 2 puffs into the lungs every 6 (six) hours as needed for wheezing. 1 Inhaler 2  . allopurinol (ZYLOPRIM) 100 MG tablet Take 100 mg by mouth daily as needed (gout).    Marland Kitchen apixaban (ELIQUIS) 5 MG TABS tablet Take 5 mg by mouth 2 (two) times daily.    . carboxymethylcellulose (REFRESH PLUS) 0.5 % SOLN Place 1 drop into both eyes daily as needed (dry eyes).    . cholecalciferol (VITAMIN D3) 25 MCG (1000 UNIT) tablet Take 1,000 Units by mouth daily.    . Cinnamon 500 MG capsule Take 500 mg by mouth daily.    Marland Kitchen diltiazem (TIAZAC) 240 MG 24 hr capsule Take 240 mg by mouth daily.    Marland Kitchen latanoprost (XALATAN) 0.005 % ophthalmic solution Place 1 drop into both eyes at  bedtime.    . Multiple Vitamins-Minerals (MULTIVITAMIN WITH MINERALS) tablet Take 1 tablet by mouth daily.    . pravastatin (PRAVACHOL) 10 MG tablet Take 10 mg by mouth at bedtime.     No current facility-administered medications for this encounter.    Allergies  Allergen Reactions  . Atorvastatin Other (See Comments)    Made pt sick   . Flecainide Nausea Only and Other (See Comments)  . Lisinopril Cough     Social History   Socioeconomic History  . Marital status: Single    Spouse name: Not on file  . Number of children: Not on file  . Years of education: Not on file  . Highest education level: Not on file  Occupational History  . Not on file  Tobacco Use  . Smoking status: Former Smoker    Packs/day: 0.50    Types: Cigarettes    Quit date: 12/18/2015    Years since quitting: 5.0  . Smokeless tobacco: Never Used  Substance and Sexual Activity  . Alcohol use: No  . Drug use: No  . Sexual activity: Not on file  Other Topics Concern  . Not on file  Social History Narrative  . Not on file   Social Determinants of Health   Financial Resource Strain: Not on file  Food Insecurity: Not on file  Transportation Needs: Not on file  Physical Activity: Not on file  Stress: Not on file  Social Connections: Not on file  Intimate Partner Violence: Not on file     ROS- All systems are reviewed and negative except as per the HPI above.  Physical Exam: Vitals:   12/17/20 1115  BP: (!) 158/76  Pulse: 73  Weight: 133.5 kg  Height: 6\' 1"  (1.854 m)    GEN- The patient is a well appearing obese male, alert and oriented x 3 today.   Head- normocephalic, atraumatic Eyes-  Sclera clear, conjunctiva pink Ears- hearing intact Oropharynx- clear Neck- supple  Lungs- Clear to ausculation bilaterally, normal work of breathing Heart- Regular rate and rhythm, no murmurs, rubs or gallops  GI- soft, NT, ND, + BS Extremities- no clubbing, cyanosis, or edema MS- no significant deformity or atrophy Skin- no rash or lesion Psych- euthymic mood, full affect Neuro- strength and sensation are intact  Wt Readings from Last 3 Encounters:  12/17/20 133.5 kg  11/20/20 135.2 kg  08/28/20 133.5 kg    EKG today demonstrates  SR Vent. rate 73 BPM PR interval 168 ms QRS duration 74 ms QT/QTcB 380/418 ms  Epic records are reviewed at length today  CHA2DS2-VASc Score = 2  The patient's  score is based upon: CHF History: No HTN History: Yes Diabetes History: Yes Stroke History: No Vascular Disease History: No Age Score: 0 Gender Score: 0      ASSESSMENT AND PLAN: 1. Persistent Atrial Fibrillation (ICD10:  I48.19) The patient's CHA2DS2-VASc score is 2, indicating a 2.2% annual risk of stroke.  S/p afib ablation 11/20/20 Patient appears to be maintaining SR. Continue diltiazem 240 mg daily Continue Eliquis 5 mg BID with no missed doses for at least 3 months post ablation.   2. Secondary Hypercoagulable State (ICD10:  D68.69) The patient is at significant risk for stroke/thromboembolism based upon his CHA2DS2-VASc Score of 2.  Continue Apixaban (Eliquis).   3. Obesity Body mass index is 38.84 kg/m. Lifestyle modification was discussed at length including regular exercise and weight reduction. Offered referral to [facility] exercise program. He will think about it.  4. Obstructive sleep apnea The importance of adequate treatment of sleep apnea was discussed today in order to improve our ability to maintain sinus rhythm long term. Patient reports compliance with CPAP therapy.  5. HTN Mildly elevated today, he reports he forget his HTN medication this morning.    Follow up with Dr Curt Bears as scheduled.    [facility] [address] [place], [place] [address] [phone] 12/17/2020 11:34 AM

## 2021-02-05 ENCOUNTER — Telehealth (INDEPENDENT_AMBULATORY_CARE_PROVIDER_SITE_OTHER): Payer: Self-pay | Admitting: Gastroenterology

## 2021-02-05 DIAGNOSIS — Z86010 Personal history of colonic polyps: Secondary | ICD-10-CM

## 2021-02-05 MED ORDER — NA SULFATE-K SULFATE-MG SULF 17.5-3.13-1.6 GM/177ML PO SOLN: 1.0000 | Freq: Once | ORAL | 0 refills | Status: AC

## 2021-02-05 NOTE — Progress Notes (Signed)
Gastroenterology Pre-Procedure Review  Request Date: 03/23/21 Requesting Physician: Dr. Vicente Males  PATIENT REVIEW QUESTIONS: The patient responded to the following health history questions as indicated:    1. Are you having any GI issues? no 2. Do you have a personal history of Polyps? yes (12/18/2019) 3. Do you have a family history of Colon Cancer or Polyps? no 4. Diabetes Mellitus? yes (Type II) 5. Joint replacements in the past 12 months?no 6. Major health problems in the past 3 months?yes (Atrial Fibrillation Ablation 11/19/2020) 7. Any artificial heart valves, MVP, or defibrillator?no    MEDICATIONS & ALLERGIES:    Patient reports the following regarding taking any anticoagulation/antiplatelet therapy:   Plavix, Coumadin, Eliquis, Xarelto, Lovenox, Pradaxa, Brilinta, or Effient? yes (Eliquis 5 mg) Aspirin? no  Patient confirms/reports the following medications:  Current Outpatient Medications  Medication Sig Dispense Refill   albuterol (PROVENTIL HFA;VENTOLIN HFA) 108 (90 BASE) MCG/ACT inhaler Inhale 2 puffs into the lungs every 6 (six) hours as needed for wheezing. 1 Inhaler 2   allopurinol (ZYLOPRIM) 100 MG tablet Take 100 mg by mouth daily as needed (gout).     apixaban (ELIQUIS) 5 MG TABS tablet Take 5 mg by mouth 2 (two) times daily.     carboxymethylcellulose (REFRESH PLUS) 0.5 % SOLN Place 1 drop into both eyes daily as needed (dry eyes).     cholecalciferol (VITAMIN D3) 25 MCG (1000 UNIT) tablet Take 1,000 Units by mouth daily.     Cinnamon 500 MG capsule Take 500 mg by mouth daily.     diltiazem (TIAZAC) 240 MG 24 hr capsule Take 240 mg by mouth daily.     latanoprost (XALATAN) 0.005 % ophthalmic solution Place 1 drop into both eyes at bedtime.     Multiple Vitamins-Minerals (MULTIVITAMIN WITH MINERALS) tablet Take 1 tablet by mouth daily.     pravastatin (PRAVACHOL) 10 MG tablet Take 10 mg by mouth at bedtime.     No current facility-administered medications for this  visit.    Patient confirms/reports the following allergies:  Allergies  Allergen Reactions   Atorvastatin Other (See Comments)    Made pt sick    Flecainide Nausea Only and Other (See Comments)   Lisinopril Cough    No orders of the defined types were placed in this encounter.   AUTHORIZATION INFORMATION Primary Insurance: 1D#: Group #:  Secondary Insurance: 1D#: Group #:  SCHEDULE INFORMATION: Date: 03/23/21 Time: Location: [facility]

## 2021-02-10 ENCOUNTER — Other Ambulatory Visit: Payer: Self-pay

## 2021-02-10 MED ORDER — PEG 3350-KCL-NA BICARB-NACL 420 G PO SOLR: 4000.0000 mL | Freq: Once | ORAL | 0 refills | Status: AC

## 2021-02-10 NOTE — Progress Notes (Signed)
New prep was sent to pharmacy.

## 2021-02-17 ENCOUNTER — Encounter: Payer: Self-pay | Admitting: Cardiology

## 2021-02-17 ENCOUNTER — Ambulatory Visit (INDEPENDENT_AMBULATORY_CARE_PROVIDER_SITE_OTHER): Payer: No Typology Code available for payment source | Admitting: Cardiology

## 2021-02-17 VITALS — BP 130/84 | HR 70 | Ht 73.0 in | Wt 291.0 lb

## 2021-02-17 DIAGNOSIS — I4819 Other persistent atrial fibrillation: Secondary | ICD-10-CM | POA: Diagnosis not present

## 2021-02-17 NOTE — Progress Notes (Signed)
Electrophysiology Office Note   Date:  02/17/2021   ID:  Steven, Powers 07-24-1956, MRN [id number]  PCP:  [name]  Cardiologist: Shanda Howells Primary Electrophysiologist:   Farhana Fellows [name], MD    Chief Complaint: Atrial fibrillation   History of Present Illness: Steven Powers is a 65 y.o. male who is being seen today for the evaluation of atrial fibrillation at the request of [name]. Presenting today for electrophysiology evaluation.  He has a history significant for diabetes, hypertension, sleep apnea, and atrial fibrillation.  He also has a history of atrial flutter and is status post ablation in 2012 in the [place].  His atrial fibrillation was diagnosed June 2021 when he went to the emergency room and was evaluated for back pain and weakness.  He has palpitations and dyspnea on exertion.  He is now status post A. fib ablation 11/20/2020.  Since his ablation he has felt much improved.  He has less shortness of breath and fatigue.  He is able to do all of his daily activities without restriction.  Today, denies symptoms of palpitations, chest pain, shortness of breath, orthopnea, PND, lower extremity edema, claudication, dizziness, presyncope, syncope, bleeding, or neurologic sequela. The patient is tolerating medications without difficulties.     Past Medical History:  Diagnosis Date   Arthritis    Diabetes mellitus without complication ([place])    Dysrhythmia    a fib with ablation "a couple of years ago"   Hypertension    Irregular heart beat    Sleep apnea    SOB (shortness of breath)    Past Surgical History:  Procedure Laterality Date   ATRIAL FIBRILLATION ABLATION N/A 11/19/2020   Procedure: ATRIAL FIBRILLATION ABLATION;  Surgeon: Constance Haw, MD;  Location: [facility];  Service: Cardiovascular;  Laterality: N/A;   COLONOSCOPY WITH PROPOFOL N/A 12/18/2019   Procedure: COLONOSCOPY WITH PROPOFOL;  Surgeon:  Jonathon Bellows, MD;  Location: [facility] ENDOSCOPY;  Service: Gastroenterology;  Laterality: N/A;   shoulder surgery left       Current Outpatient Medications  Medication Sig Dispense Refill   albuterol (PROVENTIL HFA;VENTOLIN HFA) 108 (90 BASE) MCG/ACT inhaler Inhale 2 puffs into the lungs every 6 (six) hours as needed for wheezing. 1 Inhaler 2   allopurinol (ZYLOPRIM) 100 MG tablet Take 100 mg by mouth daily as needed (gout).     apixaban (ELIQUIS) 5 MG TABS tablet Take 5 mg by mouth 2 (two) times daily.     carboxymethylcellulose (REFRESH PLUS) 0.5 % SOLN Place 1 drop into both eyes daily as needed (dry eyes).     cholecalciferol (VITAMIN D3) 25 MCG (1000 UNIT) tablet Take 1,000 Units by mouth daily.     Cinnamon 500 MG capsule Take 500 mg by mouth daily.     diltiazem (TIAZAC) 240 MG 24 hr capsule Take 240 mg by mouth daily.     latanoprost (XALATAN) 0.005 % ophthalmic solution Place 1 drop into both eyes at bedtime.     Multiple Vitamins-Minerals (MULTIVITAMIN WITH MINERALS) tablet Take 1 tablet by mouth daily.     pravastatin (PRAVACHOL) 10 MG tablet Take 10 mg by mouth at bedtime.     No current facility-administered medications for this visit.    Allergies:   Atorvastatin, Flecainide, and Lisinopril   Social History:  The patient  reports that he quit smoking about 5 years ago. His smoking use included cigarettes. He smoked an average of .5 packs per  day. He has never used smokeless tobacco. He reports that he does not drink alcohol and does not use drugs.   Family History:  The patient's family history includes Diabetes in his brother.   ROS:  Please see the history of present illness.   Otherwise, review of systems is positive for none.   All other systems are reviewed and negative.   PHYSICAL EXAM: VS:  BP 130/84 (BP Location: Left Arm, Patient Position: Sitting, Cuff Size: Normal)   Pulse 70   Ht '6\' 1"'$  (1.854 m)   Wt 291 lb (132 kg)   SpO2 93%   BMI 38.39 kg/m  , BMI Body mass  index is 38.39 kg/m. GEN: Well nourished, well developed, in no acute distress  HEENT: normal  Neck: no JVD, carotid bruits, or masses Cardiac: RRR; no murmurs, rubs, or gallops,no edema  Respiratory:  clear to auscultation bilaterally, normal work of breathing GI: soft, nontender, nondistended, + BS MS: no deformity or atrophy  Skin: warm and dry Neuro:  Strength and sensation are intact Psych: euthymic mood, full affect  EKG:  EKG is ordered today. Personal review of the ekg ordered shows sinus rhythm, rate 70  Recent Labs: 11/06/2020: BUN 12; Creatinine, Ser 1.05; Hemoglobin 15.1; Platelets 234; Potassium 4.3; Sodium 142    Lipid Panel  No results found for: CHOL, TRIG, HDL, CHOLHDL, VLDL, LDLCALC, LDLDIRECT   Wt Readings from Last 3 Encounters:  02/17/21 291 lb (132 kg)  12/17/20 294 lb 6.4 oz (133.5 kg)  11/20/20 298 lb 1 oz (135.2 kg)      Other studies Reviewed: Additional studies/ records that were reviewed today include: TTE July 2021 Review of the above records today demonstrates:  Mildly dilated right atrium                                       Ejection fraction 50 to 55%    ASSESSMENT AND PLAN:  1.  Persistent atrial fibrillation: Status post ablation 10/31/2020.  Currently on Eliquis and diltiazem.  CHA2DS2-VASc of 2.  Fortunately he remains in sinus rhythm.  We Noor Vidales continue with current management.  2.  Obstructive sleep apnea: CPAP compliance encouraged   Current medicines are reviewed at length with the patient today.   The patient does not have concerns regarding his medicines.  The following changes were made today: None  Labs/ tests ordered today include:  No orders of the defined types were placed in this encounter.    Disposition:   FU with Terrel Manalo 3 months  Signed, Jayde Mcallister [name], MD  02/17/2021 2:15 PM     [place] [address] [address] [phone] (office) [phone]  (fax)

## 2021-02-17 NOTE — Patient Instructions (Signed)
Medication Instructions:  Your physician recommends that you continue on your current medications as directed. Please refer to the Current Medication list given to you today.  *If you need a refill on your cardiac medications before your next appointment, please call your pharmacy*   Lab Work: None ordered   Testing/Procedures: None ordered   Follow-Up: At [facility], you and your health needs are our priority.  As part of our continuing mission to provide you with exceptional heart care, we have created designated Provider Care Teams.  These Care Teams include your primary Cardiologist (physician) and Advanced Practice Providers (APPs -  Physician Assistants and Nurse Practitioners) who all work together to provide you with the care you need, when you need it.  Your next appointment:   3 month(s)  The format for your next appointment:   In Person  Provider:   Will Camnitz, MD    Thank you for choosing [facility]!!   Stepheny Canal, RN [phone]     

## 2021-02-18 ENCOUNTER — Telehealth: Payer: Self-pay | Admitting: *Deleted

## 2021-02-18 NOTE — Telephone Encounter (Signed)
   [facility] Pre-operative Risk Assessment    Patient Name: Steven Powers  DOB: 1956/06/19 MRN: [id number]  HEARTCARE STAFF:  - IMPORTANT!!!!!! Under Visit Info/Reason for Call, type in Other and utilize the format Clearance MM/DD/YY or Clearance TBD. Do not use dashes or single digits. - Please review there is not already an duplicate clearance open for this procedure. - If request is for dental extraction, please clarify the # of teeth to be extracted. - If the patient is currently at the dentist's office, call Pre-Op Callback Staff (MA/nurse) to input urgent request.  - If the patient is not currently in the dentist office, please route to the Pre-Op pool.  Request for surgical clearance:  What type of surgery is being performed? COLONOSCOPY   When is this surgery scheduled?  03/23/2021  What type of clearance is required (medical clearance vs. Pharmacy clearance to hold med vs. Both)?  MEDICATION  Are there any medications that need to be held prior to surgery and how long?  [place] name and name of physician performing surgery?   [facility] / DR. ANNA   What is the office phone number?  [phone]   7.   What is the office fax number?  [phone]  8.   Anesthesia type (None, local, MAC, general) ?     Jeanann Lewandowsky 02/18/2021, 9:01 AM  _________________________________________________________________   (provider comments below)

## 2021-02-19 ENCOUNTER — Telehealth: Payer: Self-pay

## 2021-02-19 NOTE — Telephone Encounter (Signed)
Informed patient per Dr. Marlene Lard, he may stop Eliquis 2 days prior to procedure. See clearance note from Cardiology on 02/20/21. LVM explaining to patient.

## 2021-02-20 NOTE — Telephone Encounter (Signed)
Patient with diagnosis of atrial fibrillation on Eliquis for anticoagulation.    Procedure: colonoscopy Date of procedure: 03/23/21   CHA2DS2-VASc Score = 3  This indicates a 3.2% annual risk of stroke. The patient's score is based upon: CHF History: No HTN History: Yes Diabetes History: Yes Stroke History: No Vascular Disease History: Yes Age Score: 0 Gender Score: 0   CrCl 101 Platelet count 234  Per office protocol, patient can hold Eliquis for 2 days prior to procedure.   Patient will not need bridging with Lovenox (enoxaparin) around procedure.  Patient should restart Eliquis on the evening of procedure or day after, at discretion of procedure MD

## 2021-02-20 NOTE — Telephone Encounter (Signed)
   Primary Cardiologist: Will [name], MD  Chart reviewed as part of pre-operative protocol coverage. Given past medical history and time since last visit, based on ACC/AHA guidelines, Steven Powers would be at acceptable risk for the planned procedure without further cardiovascular testing.   Patient with diagnosis of atrial fibrillation on Eliquis for anticoagulation.     Procedure: colonoscopy Date of procedure: 03/23/21     CHA2DS2-VASc Score = 3  This indicates a 3.2% annual risk of stroke. The patient's score is based upon: CHF History: No HTN History: Yes Diabetes History: Yes Stroke History: No Vascular Disease History: Yes Age Score: 0 Gender Score: 0   CrCl 101 Platelet count 234   Per office protocol, patient can hold Eliquis for 2 days prior to procedure.   Patient will not need bridging with Lovenox (enoxaparin) around procedure.   Patient should restart Eliquis on the evening of procedure or day after, at discretion of procedure MD  I will route this recommendation to the requesting party via [facility] fax function and remove from pre-op pool.  Please call with questions.  [name] NP-C    02/20/2021, 11:57 AM [place] [address] Office [phone] Fax [phone]

## 2021-03-16 ENCOUNTER — Telehealth: Payer: Self-pay | Admitting: Gastroenterology

## 2021-03-16 NOTE — Telephone Encounter (Signed)
Patient wants to reschedule procedure. Clinical staff will follow up with patient.

## 2021-03-17 ENCOUNTER — Other Ambulatory Visit: Payer: Self-pay

## 2021-03-17 NOTE — Telephone Encounter (Signed)
Spoke with patient he wanted to reschedule from aug 29 till sep 7 I called trish and redid his paperwork for the new appointment

## 2021-04-01 ENCOUNTER — Telehealth: Payer: Self-pay | Admitting: Gastroenterology

## 2021-04-01 NOTE — Telephone Encounter (Signed)
Pt. Calling to reschedule his colonoscopy he says he got the dates mixed up and he has been eating.

## 2021-04-02 ENCOUNTER — Encounter: Admission: RE | Payer: Self-pay | Source: Home / Self Care

## 2021-04-02 ENCOUNTER — Ambulatory Visit
Admission: RE | Admit: 2021-04-02 | Payer: No Typology Code available for payment source | Source: Home / Self Care | Admitting: Gastroenterology

## 2021-04-02 SURGERY — COLONOSCOPY WITH PROPOFOL
Anesthesia: General

## 2021-04-06 ENCOUNTER — Telehealth: Payer: Self-pay

## 2021-04-06 ENCOUNTER — Other Ambulatory Visit: Payer: Self-pay

## 2021-04-06 DIAGNOSIS — Z1211 Encounter for screening for malignant neoplasm of colon: Secondary | ICD-10-CM

## 2021-04-06 NOTE — Progress Notes (Signed)
Rescheduled patient colonoscopy to 04/17/2021 Patient states he still has his prep medicine so no need to send in prescription again and referral sent to traci

## 2021-04-06 NOTE — Telephone Encounter (Signed)
Pt. Calling to reschedule colonoscopy

## 2021-04-06 NOTE — Telephone Encounter (Signed)
Patient was contacted but had to leave him a voicemail to call us back.

## 2021-04-17 ENCOUNTER — Ambulatory Visit: Payer: No Typology Code available for payment source | Admitting: Anesthesiology

## 2021-04-17 ENCOUNTER — Ambulatory Visit
Admission: RE | Admit: 2021-04-17 | Discharge: 2021-04-17 | Disposition: A | Discharge status: Home / Self Care | Payer: No Typology Code available for payment source | Source: Ambulatory Visit | Attending: Gastroenterology | Admitting: Gastroenterology

## 2021-04-17 ENCOUNTER — Encounter
Admission: RE | Disposition: A | Discharge status: Home / Self Care | Payer: Self-pay | Source: Ambulatory Visit | Attending: Gastroenterology

## 2021-04-17 ENCOUNTER — Encounter: Payer: Self-pay | Admitting: Gastroenterology

## 2021-04-17 DIAGNOSIS — Z7901 Long term (current) use of anticoagulants: Secondary | ICD-10-CM | POA: Diagnosis not present

## 2021-04-17 DIAGNOSIS — Z1211 Encounter for screening for malignant neoplasm of colon: Secondary | ICD-10-CM | POA: Diagnosis not present

## 2021-04-17 DIAGNOSIS — Z888 Allergy status to other drugs, medicaments and biological substances status: Secondary | ICD-10-CM | POA: Diagnosis not present

## 2021-04-17 DIAGNOSIS — D122 Benign neoplasm of ascending colon: Secondary | ICD-10-CM | POA: Insufficient documentation

## 2021-04-17 DIAGNOSIS — Z79899 Other long term (current) drug therapy: Secondary | ICD-10-CM | POA: Diagnosis not present

## 2021-04-17 DIAGNOSIS — K635 Polyp of colon: Secondary | ICD-10-CM

## 2021-04-17 DIAGNOSIS — D125 Benign neoplasm of sigmoid colon: Secondary | ICD-10-CM | POA: Diagnosis not present

## 2021-04-17 DIAGNOSIS — D123 Benign neoplasm of transverse colon: Secondary | ICD-10-CM | POA: Insufficient documentation

## 2021-04-17 DIAGNOSIS — Z87891 Personal history of nicotine dependence: Secondary | ICD-10-CM | POA: Diagnosis not present

## 2021-04-17 HISTORY — PX: COLONOSCOPY WITH PROPOFOL: SHX5780

## 2021-04-17 LAB — GLUCOSE, CAPILLARY: Glucose-Capillary: 131 mg/dL — ABNORMAL HIGH (ref 70–99)

## 2021-04-17 SURGERY — COLONOSCOPY WITH PROPOFOL
Anesthesia: General

## 2021-04-17 MED ORDER — PROPOFOL 500 MG/50ML IV EMUL
INTRAVENOUS | Status: DC | PRN
  Administered 2021-04-17: 50 ug/kg/min via INTRAVENOUS

## 2021-04-17 MED ORDER — LIDOCAINE HCL (PF) 2 % IJ SOLN
INTRAMUSCULAR | Status: AC
  Filled 2021-04-17: qty 5

## 2021-04-17 MED ORDER — FENTANYL CITRATE (PF) 100 MCG/2ML IJ SOLN
INTRAMUSCULAR | Status: DC | PRN
  Administered 2021-04-17 (×2): 50 ug via INTRAVENOUS

## 2021-04-17 MED ORDER — LIDOCAINE HCL (CARDIAC) PF 100 MG/5ML IV SOSY
PREFILLED_SYRINGE | INTRAVENOUS | Status: DC | PRN
  Administered 2021-04-17: 50 mg via INTRAVENOUS

## 2021-04-17 MED ORDER — PROPOFOL 10 MG/ML IV BOLUS
INTRAVENOUS | Status: DC | PRN
Start: 2021-04-17 — End: 2021-04-17
  Administered 2021-04-17: 50 mg via INTRAVENOUS

## 2021-04-17 MED ORDER — SODIUM CHLORIDE 0.9 % IV SOLN
INTRAVENOUS | Status: DC
  Administered 2021-04-17: 20 mL/h via INTRAVENOUS

## 2021-04-17 MED ORDER — FENTANYL CITRATE (PF) 100 MCG/2ML IJ SOLN
INTRAMUSCULAR | Status: AC
  Filled 2021-04-17: qty 2

## 2021-04-17 MED ORDER — MIDAZOLAM HCL 2 MG/2ML IJ SOLN
INTRAMUSCULAR | Status: DC | PRN
  Administered 2021-04-17: 2 mg via INTRAVENOUS

## 2021-04-17 MED ORDER — MIDAZOLAM HCL 2 MG/2ML IJ SOLN
INTRAMUSCULAR | Status: AC
  Filled 2021-04-17: qty 2

## 2021-04-17 MED ORDER — EPHEDRINE 5 MG/ML INJ
INTRAVENOUS | Status: AC
  Filled 2021-04-17: qty 5

## 2021-04-17 NOTE — H&P (Signed)
Steven Bellows, MD [address], [place], [place], [place], [address] [address], [place], [place], [place], [address] Phone: [phone]  Fax: [phone]  Primary Care Physician:  [name]   Pre-Procedure History & Physical: HPI:  Steven Powers is a 65 y.o. male is here for an colonoscopy.   Past Medical History:  Diagnosis Date   Arthritis    Diabetes mellitus without complication ([place])    Dysrhythmia    a fib with ablation "a couple of years ago"   Hypertension    Irregular heart beat    Sleep apnea    SOB (shortness of breath)     Past Surgical History:  Procedure Laterality Date   ATRIAL FIBRILLATION ABLATION N/A 11/19/2020   Procedure: ATRIAL FIBRILLATION ABLATION;  Surgeon: Constance Haw, MD;  Location: [facility];  Service: Cardiovascular;  Laterality: N/A;   COLONOSCOPY WITH PROPOFOL N/A 12/18/2019   Procedure: COLONOSCOPY WITH PROPOFOL;  Surgeon: Steven Bellows, MD;  Location: [facility] ENDOSCOPY;  Service: Gastroenterology;  Laterality: N/A;   shoulder surgery left      Prior to Admission medications   Medication Sig Start Date End Date Taking? Authorizing Provider  albuterol (PROVENTIL HFA;VENTOLIN HFA) 108 (90 BASE) MCG/ACT inhaler Inhale 2 puffs into the lungs every 6 (six) hours as needed for wheezing. 11/05/12  Yes Pollina, Gwenyth Allegra, MD  allopurinol (ZYLOPRIM) 100 MG tablet Take 100 mg by mouth daily as needed (gout). 03/06/20  Yes [provider]  apixaban (ELIQUIS) 5 MG TABS tablet Take 5 mg by mouth 2 (two) times daily. 02/06/20  Yes [provider]  carboxymethylcellulose (REFRESH PLUS) 0.5 % SOLN Place 1 drop into both eyes daily as needed (dry eyes). 06/13/20  Yes [provider]  cholecalciferol (VITAMIN D3) 25 MCG (1000 UNIT) tablet Take 1,000 Units by mouth daily.   Yes [provider]  Cinnamon 500 MG capsule Take 500 mg by mouth daily.   Yes [provider]  diltiazem  (TIAZAC) 240 MG 24 hr capsule Take 240 mg by mouth daily. 05/09/20  Yes [provider]  latanoprost (XALATAN) 0.005 % ophthalmic solution Place 1 drop into both eyes at bedtime. 06/13/20  Yes [provider]  Multiple Vitamins-Minerals (MULTIVITAMIN WITH MINERALS) tablet Take 1 tablet by mouth daily.   Yes [provider]  pravastatin (PRAVACHOL) 10 MG tablet Take 10 mg by mouth at bedtime. 08/05/20  Yes [provider]    Allergies as of 04/06/2021 - Review Complete 02/17/2021  Allergen Reaction Noted   Atorvastatin Other (See Comments) 03/27/2020   Flecainide Nausea Only and Other (See Comments) 08/05/2020   Lisinopril Cough 01/17/2020    Family History  Problem Relation Age of Onset   Diabetes Brother     Social History   Socioeconomic History   Marital status: Divorced    Spouse name: Not on file   Number of children: Not on file   Years of education: Not on file   Highest education level: Not on file  Occupational History   Not on file  Tobacco Use   Smoking status: Former    Packs/day: 0.50    Types: Cigarettes    Quit date: 12/18/2015    Years since quitting: 5.3   Smokeless tobacco: Never  Substance and Sexual Activity   Alcohol use: No   Drug use: No   Sexual activity: Not on file  Other Topics Concern   Not on file  Social  History Narrative   Not on file   Social Determinants of Health   Financial Resource Strain: Not on file  Food Insecurity: Not on file  Transportation Needs: Not on file  Physical Activity: Not on file  Stress: Not on file  Social Connections: Not on file  Intimate Partner Violence: Not on file    Review of Systems: See HPI, otherwise negative ROS  Physical Exam: BP (!) 154/96   Pulse 79   Temp (!) 96.7 F (35.9 C) (Temporal)   Resp 20   Ht 6\' 1"  (1.854 m)   Wt 128.4 kg   SpO2 100%   BMI 37.34 kg/m  General:   Alert,  pleasant and cooperative in NAD Head:  Normocephalic and  atraumatic. Neck:  Supple; no masses or thyromegaly. Lungs:  Clear throughout to auscultation, normal respiratory effort.    Heart:  +S1, +S2, Regular rate and rhythm, No edema. Abdomen:  Soft, nontender and nondistended. Normal bowel sounds, without guarding, and without rebound.   Neurologic:  Alert and  oriented x4;  grossly normal neurologically.  Impression/Plan: Steven Powers is here for an colonoscopy to be performed for Screening colonoscopy , last priocedure had suboptimal bowel prep  Risks, benefits, limitations, and alternatives regarding  colonoscopy have been reviewed with the patient.  Questions have been answered.  All parties agreeable.   Steven Bellows, MD  04/17/2021, 8:09 AM

## 2021-04-17 NOTE — Anesthesia Postprocedure Evaluation (Signed)
Anesthesia Post Note  Patient: Steven Powers  Procedure(s) Performed: COLONOSCOPY WITH PROPOFOL  Patient location during evaluation: Endoscopy Anesthesia Type: General Level of consciousness: awake and alert Pain management: pain level controlled Vital Signs Assessment: post-procedure vital signs reviewed and stable Respiratory status: spontaneous breathing, nonlabored ventilation and respiratory function stable Cardiovascular status: blood pressure returned to baseline and stable Postop Assessment: no apparent nausea or vomiting Anesthetic complications: no   No notable events documented.   Last Vitals:  Vitals:   04/17/21 0946 04/17/21 0956  BP: (!) 154/99 (!) 147/101  Pulse: 71 67  Resp: 19 (!) 24  Temp:    SpO2: 94% 99%    Last Pain:  Vitals:   04/17/21 0956  TempSrc:   PainSc: 0-No pain                 Iran Ouch

## 2021-04-17 NOTE — Anesthesia Preprocedure Evaluation (Signed)
Anesthesia Evaluation  Patient identified by MRN, date of birth, ID band Patient awake    Reviewed: Allergy & Precautions, NPO status , Patient's Chart, lab work & pertinent test results  Airway Mallampati: III  TM Distance: >3 FB Neck ROM: Full    Dental  (+) Dental Advisory Given, Partial Upper   Pulmonary sleep apnea and Continuous Positive Airway Pressure Ventilation , COPD,  COPD inhaler, former smoker,    breath sounds clear to auscultation       Cardiovascular Exercise Tolerance: Good hypertension, Pt. on medications + CAD  + dysrhythmias (s/p afib with ablation)  Rhythm:Regular Rate:Normal     Neuro/Psych  Neuromuscular disease negative psych ROS   GI/Hepatic negative GI ROS, Neg liver ROS,   Endo/Other  diabetes, Type 2Obesity  Renal/GU negative Renal ROS     Musculoskeletal   Abdominal (+) + obese,   Peds  Hematology negative hematology ROS (+)   Anesthesia Other Findings   Reproductive/Obstetrics                             Anesthesia Physical  Anesthesia Plan  ASA: 3  Anesthesia Plan: General   Post-op Pain Management:    Induction: Intravenous  PONV Risk Score and Plan: 2 and Treatment may vary due to age or medical condition and Propofol infusion  Airway Management Planned: Nasal Cannula and Natural Airway  Additional Equipment: None  Intra-op Plan:   Post-operative Plan:   Informed Consent: I have reviewed the patients History and Physical, chart, labs and discussed the procedure including the risks, benefits and alternatives for the proposed anesthesia with the patient or authorized representative who has indicated his/her understanding and acceptance.     Dental advisory given  Plan Discussed with: CRNA  Anesthesia Plan Comments:         Anesthesia Quick Evaluation

## 2021-04-17 NOTE — Transfer of Care (Signed)
Immediate Anesthesia Transfer of Care Note  Patient: Steven Powers  Procedure(s) Performed: COLONOSCOPY WITH PROPOFOL  Patient Location: PACU  Anesthesia Type:General  Level of Consciousness: sedated  Airway & Oxygen Therapy: Patient Spontanous Breathing and Patient connected to nasal cannula oxygen  Post-op Assessment: Report given to RN and Post -op Vital signs reviewed and stable  Post vital signs: Reviewed and stable  Last Vitals:  Vitals Value Taken Time  BP 132/89 04/17/21 0925  Temp    Pulse 71 04/17/21 0925  Resp 19 04/17/21 0925  SpO2 93 % 04/17/21 0925  Vitals shown include unvalidated device data.  Last Pain:  Vitals:   04/17/21 0730  TempSrc: Temporal  PainSc: 0-No pain         Complications: No notable events documented.

## 2021-04-17 NOTE — Op Note (Signed)
[facility] Gastroenterology Patient Name: Steven Powers Procedure Date: 04/17/2021 8:49 AM MRN: [id number] Account #: [id number] Date of Birth: [birth year] Admit Type: Outpatient Age: 65 Room: [facility] ENDO ROOM 4 Gender: Male Note Status: Finalized Instrument Name: Jasper Riling [id number] Procedure:             Colonoscopy Indications:           Surveillance: Piecemeal removal of large sessile                         adenoma last colonoscopy (< 3 yrs) Providers:             Jonathon Bellows MD, MD Referring MD:          [facility] (Referring MD) Medicines:             Monitored Anesthesia Care Complications:         No immediate complications. Procedure:             Pre-Anesthesia Assessment:                        - Prior to the procedure, a History and Physical was                         performed, and patient medications, allergies and                         sensitivities were reviewed. The patient's tolerance                         of previous anesthesia was reviewed.                        - The risks and benefits of the procedure and the                         sedation options and risks were discussed with the                         patient. All questions were answered and informed                         consent was obtained.                        - ASA Grade Assessment: II - A patient with mild                         systemic disease.                        After obtaining informed consent, the colonoscope was                         passed under direct vision. Throughout the procedure,                         the patient's blood pressure, pulse, and oxygen                         saturations  were monitored continuously. The                         Colonoscope was introduced through the anus and                         advanced to the the cecum, identified by the                         appendiceal orifice. The colonoscopy was performed                          with moderate difficulty due to significant looping.                         The patient tolerated the procedure well. The quality                         of the bowel preparation was poor. Findings:      The perianal and digital rectal examinations were normal.      Four sessile polyps were found in the sigmoid colon, transverse colon       and ascending colon. The polyps were 4 to 6 mm in size. These polyps       were removed with a cold snare. Resection and retrieval were complete.      A large amount of semi-solid stool was found in the entire colon,       interfering with visualization. Impression:            - Preparation of the colon was poor.                        - Four 4 to 6 mm polyps in the sigmoid colon, in the                         transverse colon and in the ascending colon, removed                         with a cold snare. Resected and retrieved.                        - Stool in the entire examined colon. Recommendation:        - Discharge patient to home (with escort).                        - Resume previous diet.                        - Continue present medications.                        - Repeat colonoscopy in 5 months because the bowel                         preparation was suboptimal. Procedure Code(s):     --- Professional ---                        [phone], Colonoscopy, flexible; with removal of  tumor(s), polyp(s), or other lesion(s) by snare                         technique Diagnosis Code(s):     --- Professional ---                        Z86.010, Personal history of colonic polyps                        K63.5, Polyp of colon CPT copyright 2019 American Medical Association. All rights reserved. The codes documented in this report are preliminary and upon coder review may  be revised to meet current compliance requirements. Jonathon Bellows, MD Jonathon Bellows MD, MD 04/17/2021 9:21:43 AM This report has been signed  electronically. Number of Addenda: 0 Note Initiated On: 04/17/2021 8:49 AM Scope Withdrawal Time: 0 hours 20 minutes 28 seconds  Total Procedure Duration: 0 hours 23 minutes 15 seconds  Estimated Blood Loss:  Estimated blood loss: none.      [facility]

## 2021-04-20 LAB — SURGICAL PATHOLOGY

## 2021-04-26 ENCOUNTER — Encounter: Payer: Self-pay | Admitting: Gastroenterology

## 2021-05-21 ENCOUNTER — Ambulatory Visit: Payer: Self-pay | Admitting: Cardiology

## 2021-11-28 IMAGING — CT CT HEART MORPH/PULM VEIN W/ CM & W/O CA SCORE
2 of 6 series · 12 of 20 positions shown, 14 images · IV contrast (Omni 300)
Comparison: 08/15/2020 chest radiograph from [HOSPITAL] [facility]
COMPARISON: 08/15/2020 chest radiograph from [HOSP[facility]] [facility]

Addendum:
EXAM:
OVER-READ INTERPRETATION  CT CHEST

The following report is an over-read performed by radiologist Dr.
Nelson T Nomesque [REDACTED] on 11/13/2020. This over-read
does not include interpretation of cardiac or coronary anatomy or
pathology. The coronary CTA interpretation by the cardiologist is
attached.
CLINICAL DATA: Atrial fibrillation scheduled for ablation.
Cardiac CTA
TECHNIQUE: A non-contrast, gated CT scan was obtained with axial slices of 3 mm
through the heart for calcium scoring. Calcium scoring was performed
using the Agatston method. A 120 kV retrospective, gated, contrast
cardiac scan was obtained. Gantry rotation speed was 250 msecs and
collimation was 0.6 mm. Nitroglycerin was not given. A delayed scan
was obtained to exclude left atrial appendage thrombus. The 3D
dataset was reconstructed in 5% intervals of the 0-95% of the R-R
cycle. Late systolic phases were analyzed on a dedicated workstation
using MPR, MIP, and VRT modes. The patient received 80 cc of
contrast.

[Series 7: 0-90% · axial · 0.39mm/px · z∈[+1441,+1534]mm · 6 of 2590 slices shown, 8 images]
[im 370/2590  vessel]
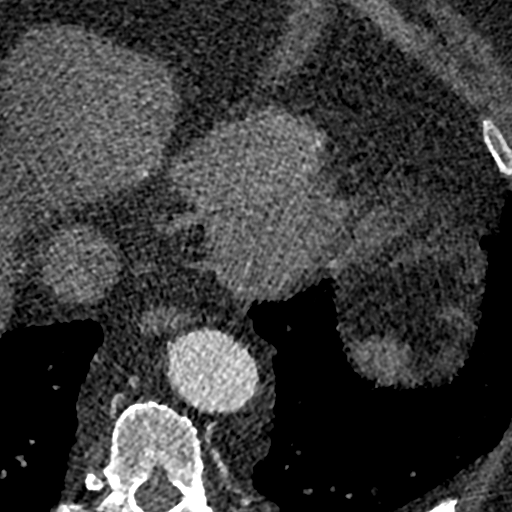
[im 370/2590  lung]
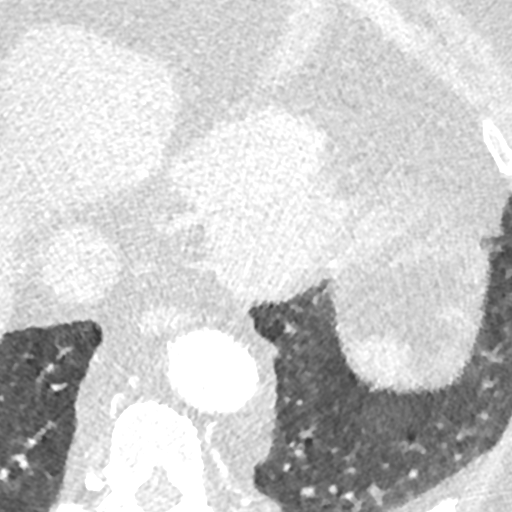
[im 740/2590  vessel]
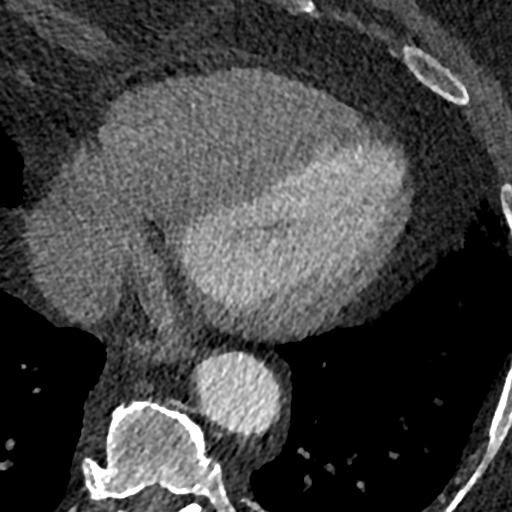
[im 1110/2590  vessel]
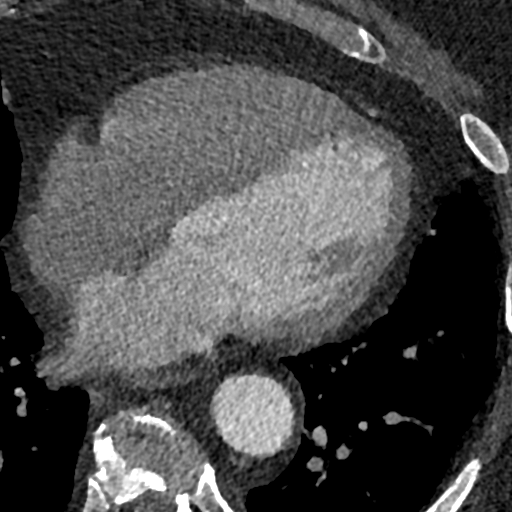
[im 1480/2590  vessel]
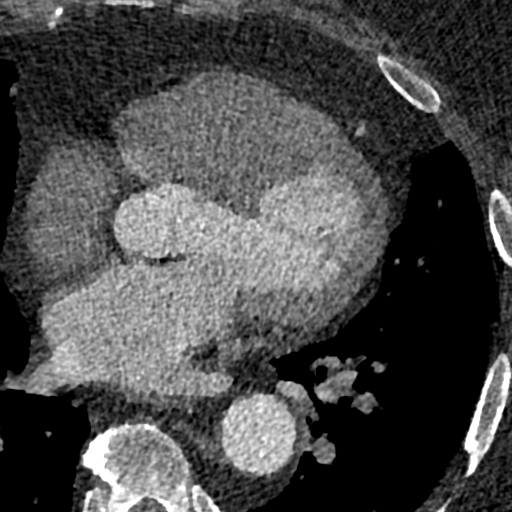
[im 1850/2590  vessel]
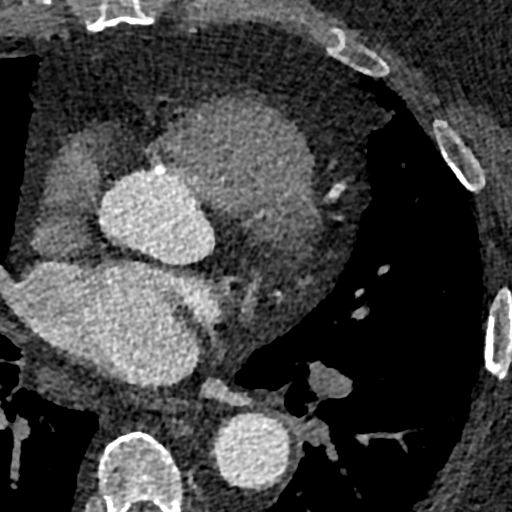
[im 1850/2590  lung]
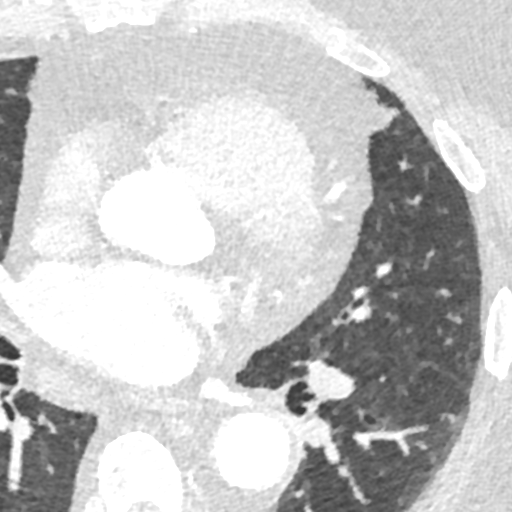
[im 2220/2590  vessel]
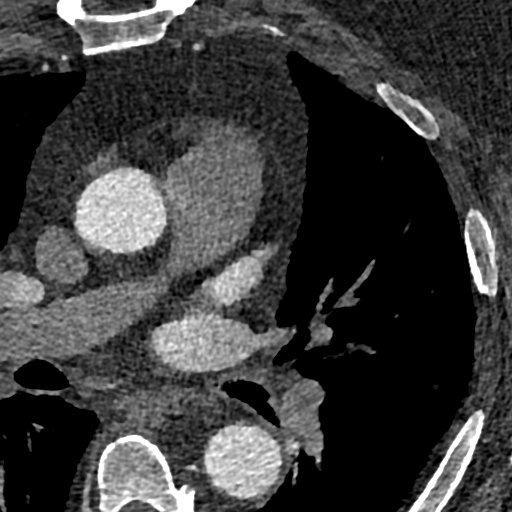

[Series 8: 5-95% · axial · 0.39mm/px · z∈[+1441,+1534]mm · 6 of 2590 slices shown]
[im 370/2590  vessel]
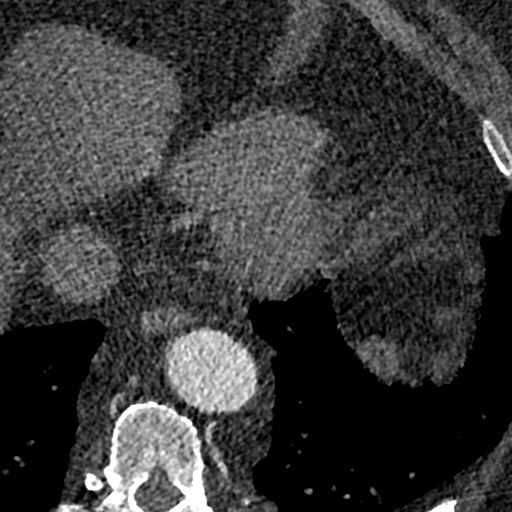
[im 740/2590  vessel]
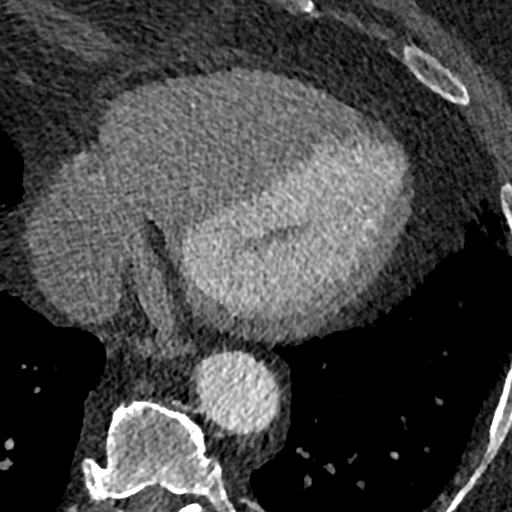
[im 1110/2590  vessel]
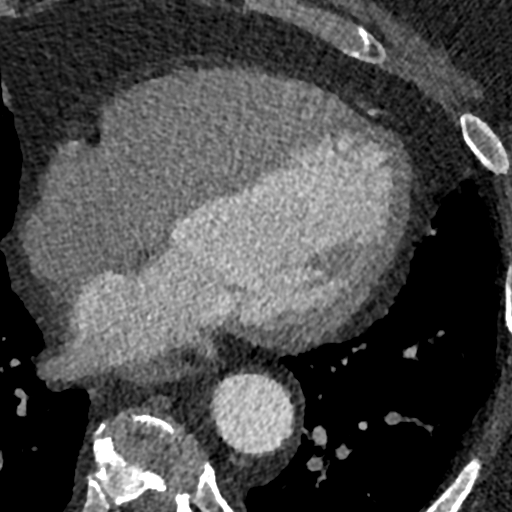
[im 1480/2590  vessel]
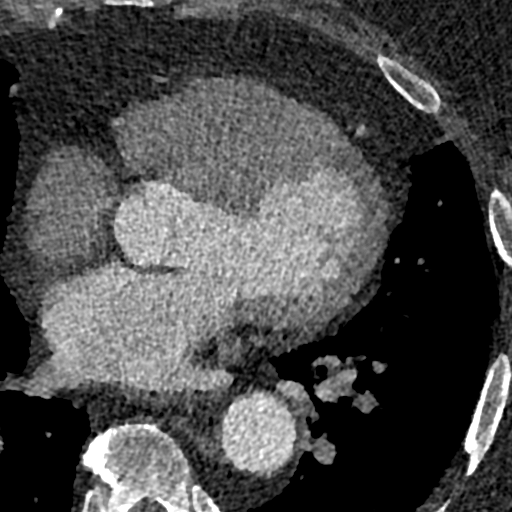
[im 1850/2590  vessel]
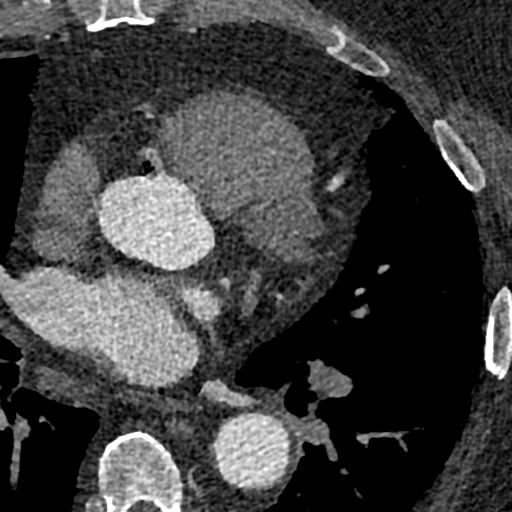
[im 2220/2590  vessel]
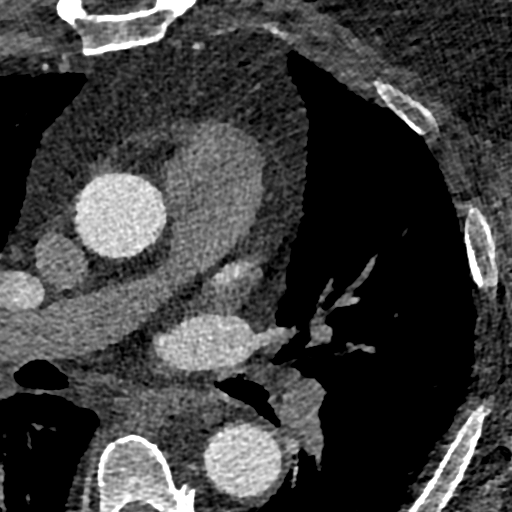

[12 of 20 positions shown; findings below may reference images not displayed]

FINDINGS: Vascular: Aortic atherosclerosis. No central pulmonary embolism, on
this non-dedicated study.

Mediastinum/Nodes: Small middle mediastinal nodes are not pathologic
by size criteria. 1.1 cm right hilar node on [DATE].

Right infrahilar node of 1.3 cm on [DATE].

Lungs/Pleura: No pleural fluid. 6 mm nodule on the right minor
fissure on image [DATE] is most likely a subpleural lymph node.

3-4 mm nodule on the left major fissure on [DATE] is also favored to
represent a subpleural lymph node.

Upper Abdomen: Arterially hyperenhancing segment 2 3.3 cm liver
lesion on 65/9. Surrounding altered perfusion. Normal imaged
portions of the spleen, stomach.

Musculoskeletal: Mild bilateral gynecomastia. Posterolateral right
lower rib remote fracture.
IMPRESSION: 1. Bilateral probable subpleural lymph nodes up to 6 mm.
Non-contrast chest CT at 6-12 months is recommended. If the nodule
is stable at time of repeat CT, then future CT at 18-24 months (from
today's scan) is considered optional for low-risk patients, but is
recommended for high-risk patients. This recommendation follows the
consensus statement: Guidelines for Management of Incidental
Pulmonary Nodules Detected on CT Images: From the [HOSPITAL]
2. Prominent right hilar and infrahilar nodes are likely reactive.
Recommend attention on follow-up.
3. High left hepatic lobe incompletely imaged lesion is favored to
represent a hemangioma. If any history of liver disease or primary
malignancy, dedicated pre and post contrast abdominal MRI could be
performed. Alternatively, this could be re-evaluated on follow-up
chest CT.
4.  Aortic Atherosclerosis (A45JO-OHD.D).
5. Remote lower right rib fracture.

These results will be called to the ordering clinician or
representative by the Radiologist Assistant, and communication
documented in the PACS or [REDACTED].
FINDINGS: Image quality: Average

Pulmonary Veins: There is normal pulmonary vein drainage into the
left atrium (2 on the right and 2 on the left) with ostial
measurements as follows:

RUPV: Ostium 30 x 23 mm  area 4.9 cm^2

RLPV:  Ostium 23 x 21 mm  area 3.48 cm^2

LUPV:  Ostium 26 x 21 mm area 4.1 cm^2

LLPV:  Ostium 20.5 x 14 mm  area 2.16 cm^2

Left Atrium: The left atrial size is normal. There is no PFO/ASD.
There is no thrombus in the left atrial appendage on contrast or
delayed imaging. The esophagus runs in the left atrial midline and
is not in proximity to any of the pulmonary vein ostia. The left
lower pulmonary vein is in close proximity to the descending
thoracic aorta.

Coronary Arteries: CAC score of 6655 Agatston units, which is 94th
percentile for age-, race-, and sex-matched controls. Normal
coronary origin. Right dominance. The study was performed without
use of NTG and is insufficient for plaque evaluation.

Right Atrium: Right atrial size is mildly dilated.

Right Ventricle: The right ventricular cavity is within normal
limits.

Left Ventricle: The ventricular cavity size is within normal limits.
There are no stigmata of prior infarction. There is no abnormal
filling defect.

Pericardium: Normal thickness with no significant effusion or
calcium present.

Pulmonary Artery: Mildly dilated.

Aorta: Normal caliber with no significant disease.

Extra-cardiac findings: See attached radiology report for
non-cardiac structures.
IMPRESSION: 1. There is normal pulmonary vein drainage into the left atrium with
ostial measurements above.

2. There is no thrombus in the left atrial appendage.

3. The esophagus runs in the left atrial midline and is not in
proximity to any of the pulmonary vein ostia.

4. No PFO/ASD.

5. Normal coronary origin. Right dominance.

6. CAC score of 6655 Agatston units which is 94th percentile for
age-, race-, and sex-matched controls.

*** End of Addendum ***
EXAM:
OVER-READ INTERPRETATION  CT CHEST

The following report is an over-read performed by radiologist Dr.
Nelson T Nomesque [REDACTED] on 11/13/2020. This over-read
does not include interpretation of cardiac or coronary anatomy or
pathology. The coronary CTA interpretation by the cardiologist is
attached.
FINDINGS: Vascular: Aortic atherosclerosis. No central pulmonary embolism, on
this non-dedicated study.

Mediastinum/Nodes: Small middle mediastinal nodes are not pathologic
by size criteria. 1.1 cm right hilar node on [DATE].

Right infrahilar node of 1.3 cm on [DATE].

Lungs/Pleura: No pleural fluid. 6 mm nodule on the right minor
fissure on image [DATE] is most likely a subpleural lymph node.

3-4 mm nodule on the left major fissure on [DATE] is also favored to
represent a subpleural lymph node.

Upper Abdomen: Arterially hyperenhancing segment 2 3.3 cm liver
lesion on 65/9. Surrounding altered perfusion. Normal imaged
portions of the spleen, stomach.

Musculoskeletal: Mild bilateral gynecomastia. Posterolateral right
lower rib remote fracture.
IMPRESSION: 1. Bilateral probable subpleural lymph nodes up to 6 mm.
Non-contrast chest CT at 6-12 months is recommended. If the nodule
is stable at time of repeat CT, then future CT at 18-24 months (from
today's scan) is considered optional for low-risk patients, but is
recommended for high-risk patients. This recommendation follows the
consensus statement: Guidelines for Management of Incidental
Pulmonary Nodules Detected on CT Images: From the [HOSPITAL]
2. Prominent right hilar and infrahilar nodes are likely reactive.
Recommend attention on follow-up.
3. High left hepatic lobe incompletely imaged lesion is favored to
represent a hemangioma. If any history of liver disease or primary
malignancy, dedicated pre and post contrast abdominal MRI could be
performed. Alternatively, this could be re-evaluated on follow-up
chest CT.
4.  Aortic Atherosclerosis (A45JO-OHD.D).
5. Remote lower right rib fracture.

These results will be called to the ordering clinician or
representative by the Radiologist Assistant, and communication
documented in the PACS or [REDACTED].
